# Patient Record
Sex: Female | Born: 1970 | Race: Black or African American | Hispanic: No | Marital: Single | Smoking: Current every day smoker
Health system: Southern US, Community
[De-identification: ages and names within clinical notes are randomized; demographics above are authoritative.]

## PROBLEM LIST (undated history)

## (undated) DIAGNOSIS — F17200 Nicotine dependence, unspecified, uncomplicated: Secondary | ICD-10-CM

## (undated) DIAGNOSIS — T7840XA Allergy, unspecified, initial encounter: Secondary | ICD-10-CM

## (undated) DIAGNOSIS — D649 Anemia, unspecified: Secondary | ICD-10-CM

## (undated) DIAGNOSIS — I10 Essential (primary) hypertension: Secondary | ICD-10-CM

## (undated) HISTORY — DX: Anemia, unspecified: D64.9

## (undated) HISTORY — DX: Nicotine dependence, unspecified, uncomplicated: F17.200

## (undated) HISTORY — DX: Essential (primary) hypertension: I10

## (undated) HISTORY — PX: TONSILLECTOMY: SUR1361

## (undated) HISTORY — DX: Allergy, unspecified, initial encounter: T78.40XA

---

## 1988-05-01 DIAGNOSIS — T7840XA Allergy, unspecified, initial encounter: Secondary | ICD-10-CM

## 1988-05-01 HISTORY — DX: Allergy, unspecified, initial encounter: T78.40XA

## 1995-05-02 DIAGNOSIS — F17200 Nicotine dependence, unspecified, uncomplicated: Secondary | ICD-10-CM

## 1995-05-02 HISTORY — DX: Nicotine dependence, unspecified, uncomplicated: F17.200

## 2003-12-21 ENCOUNTER — Ambulatory Visit (HOSPITAL_COMMUNITY): Admission: RE | Admit: 2003-12-21 | Discharge: 2003-12-21 | Payer: Self-pay | Admitting: Family Medicine

## 2004-03-23 ENCOUNTER — Ambulatory Visit: Payer: Self-pay | Admitting: Family Medicine

## 2004-05-05 ENCOUNTER — Ambulatory Visit: Payer: Self-pay | Admitting: Family Medicine

## 2004-06-28 ENCOUNTER — Ambulatory Visit: Payer: Self-pay | Admitting: Family Medicine

## 2004-09-19 ENCOUNTER — Ambulatory Visit: Payer: Self-pay | Admitting: Family Medicine

## 2005-02-09 ENCOUNTER — Ambulatory Visit: Payer: Self-pay | Admitting: Family Medicine

## 2005-06-19 ENCOUNTER — Ambulatory Visit: Payer: Self-pay | Admitting: Family Medicine

## 2005-11-07 ENCOUNTER — Ambulatory Visit: Payer: Self-pay | Admitting: Family Medicine

## 2006-03-01 ENCOUNTER — Ambulatory Visit: Payer: Self-pay | Admitting: Family Medicine

## 2006-03-01 ENCOUNTER — Other Ambulatory Visit: Admission: RE | Admit: 2006-03-01 | Discharge: 2006-03-01 | Payer: Self-pay | Admitting: Family Medicine

## 2006-03-01 ENCOUNTER — Ambulatory Visit (HOSPITAL_COMMUNITY): Admission: RE | Admit: 2006-03-01 | Discharge: 2006-03-01 | Payer: Self-pay | Admitting: Family Medicine

## 2007-03-06 ENCOUNTER — Ambulatory Visit: Payer: Self-pay | Admitting: Family Medicine

## 2007-03-06 ENCOUNTER — Other Ambulatory Visit: Admission: RE | Admit: 2007-03-06 | Discharge: 2007-03-06 | Payer: Self-pay | Admitting: Family Medicine

## 2007-03-06 ENCOUNTER — Encounter: Payer: Self-pay | Admitting: Family Medicine

## 2007-03-23 ENCOUNTER — Encounter: Payer: Self-pay | Admitting: Family Medicine

## 2007-03-23 LAB — CONVERTED CEMR LAB
ALT: 8 units/L (ref 0–35)
AST: 13 units/L (ref 0–37)
Basophils Relative: 0 % (ref 0–1)
Bilirubin, Direct: 0.1 mg/dL (ref 0.0–0.3)
Blood Glucose, Fasting: 94 mg/dL
Calcium: 9.4 mg/dL (ref 8.4–10.5)
Cholesterol: 178 mg/dL (ref 0–200)
Eosinophils Absolute: 0.2 10*3/uL (ref 0.2–0.7)
Eosinophils Relative: 3 % (ref 0–5)
Glucose, Bld: 94 mg/dL (ref 70–99)
HCT: 36.9 % (ref 36.0–46.0)
Hgb A1c MFr Bld: 5.5 % (ref 4.6–6.1)
Indirect Bilirubin: 0.2 mg/dL (ref 0.0–0.9)
Lymphs Abs: 2.5 10*3/uL (ref 0.7–4.0)
MCHC: 30.9 g/dL (ref 30.0–36.0)
MCV: 85.8 fL (ref 78.0–100.0)
Platelets: 183 10*3/uL (ref 150–400)
RDW: 13.6 % (ref 11.5–15.5)
Sodium: 141 meq/L (ref 135–145)
Total CHOL/HDL Ratio: 3.1

## 2007-05-21 ENCOUNTER — Encounter: Payer: Self-pay | Admitting: Family Medicine

## 2007-05-21 DIAGNOSIS — F17200 Nicotine dependence, unspecified, uncomplicated: Secondary | ICD-10-CM

## 2007-05-21 DIAGNOSIS — J301 Allergic rhinitis due to pollen: Secondary | ICD-10-CM

## 2007-05-21 DIAGNOSIS — G4700 Insomnia, unspecified: Secondary | ICD-10-CM | POA: Insufficient documentation

## 2007-12-03 ENCOUNTER — Encounter: Payer: Self-pay | Admitting: Family Medicine

## 2007-12-03 ENCOUNTER — Ambulatory Visit: Payer: Self-pay | Admitting: Family Medicine

## 2007-12-05 DIAGNOSIS — G44229 Chronic tension-type headache, not intractable: Secondary | ICD-10-CM

## 2008-03-04 ENCOUNTER — Ambulatory Visit: Payer: Self-pay | Admitting: Family Medicine

## 2008-03-04 ENCOUNTER — Other Ambulatory Visit: Admission: RE | Admit: 2008-03-04 | Discharge: 2008-03-04 | Payer: Self-pay | Admitting: Family Medicine

## 2008-03-04 ENCOUNTER — Encounter: Payer: Self-pay | Admitting: Family Medicine

## 2008-03-04 DIAGNOSIS — N771 Vaginitis, vulvitis and vulvovaginitis in diseases classified elsewhere: Secondary | ICD-10-CM

## 2008-03-05 ENCOUNTER — Encounter: Payer: Self-pay | Admitting: Family Medicine

## 2008-03-05 LAB — CONVERTED CEMR LAB
Candida species: NEGATIVE
Chlamydia, DNA Probe: NEGATIVE
Trichomonal Vaginitis: NEGATIVE

## 2008-03-06 DIAGNOSIS — B351 Tinea unguium: Secondary | ICD-10-CM | POA: Insufficient documentation

## 2008-03-21 ENCOUNTER — Ambulatory Visit (HOSPITAL_COMMUNITY): Admission: RE | Admit: 2008-03-21 | Discharge: 2008-03-21 | Payer: Self-pay | Admitting: Family Medicine

## 2008-03-21 ENCOUNTER — Encounter: Payer: Self-pay | Admitting: Family Medicine

## 2008-03-23 ENCOUNTER — Encounter: Payer: Self-pay | Admitting: Family Medicine

## 2008-04-01 ENCOUNTER — Telehealth: Payer: Self-pay | Admitting: Family Medicine

## 2008-04-03 LAB — CONVERTED CEMR LAB
ALT: <8 units/L (ref 0–35)
Albumin: 4.3 g/dL (ref 3.5–5.2)
Alkaline Phosphatase: 48 units/L (ref 39–117)
Basophils Absolute: 0 10*3/uL (ref 0.0–0.1)
Basophils Relative: 1 % (ref 0–1)
Chloride: 105 meq/L (ref 96–112)
HDL: 67 mg/dL (ref >39)
LDL Cholesterol: 97 mg/dL (ref 0–99)
MCHC: 30.5 g/dL (ref 30.0–36.0)
Neutro Abs: 3.6 10*3/uL (ref 1.7–7.7)
Neutrophils Relative %: 60 % (ref 43–77)
Platelets: 198 10*3/uL (ref 150–400)
Potassium: 3.9 meq/L (ref 3.5–5.3)
RDW: 13.7 % (ref 11.5–15.5)
Total Protein: 6.9 g/dL (ref 6.0–8.3)
Triglycerides: 69 mg/dL (ref <150)
VLDL: 14 mg/dL (ref 0–40)

## 2008-06-09 ENCOUNTER — Encounter: Payer: Self-pay | Admitting: Family Medicine

## 2008-06-11 ENCOUNTER — Telehealth: Payer: Self-pay | Admitting: Family Medicine

## 2008-06-17 ENCOUNTER — Telehealth: Payer: Self-pay | Admitting: Family Medicine

## 2008-06-22 ENCOUNTER — Telehealth: Payer: Self-pay | Admitting: Family Medicine

## 2008-06-24 ENCOUNTER — Telehealth: Payer: Self-pay | Admitting: Family Medicine

## 2008-07-07 ENCOUNTER — Telehealth: Payer: Self-pay | Admitting: Family Medicine

## 2008-07-08 ENCOUNTER — Encounter: Payer: Self-pay | Admitting: Family Medicine

## 2008-10-26 ENCOUNTER — Telehealth: Payer: Self-pay | Admitting: Family Medicine

## 2008-11-09 ENCOUNTER — Telehealth: Payer: Self-pay | Admitting: Family Medicine

## 2009-01-09 ENCOUNTER — Emergency Department (HOSPITAL_COMMUNITY): Admission: EM | Admit: 2009-01-09 | Discharge: 2009-01-09 | Payer: Self-pay | Admitting: Emergency Medicine

## 2009-01-12 ENCOUNTER — Ambulatory Visit: Payer: Self-pay | Admitting: Family Medicine

## 2009-03-01 ENCOUNTER — Telehealth: Payer: Self-pay | Admitting: Family Medicine

## 2009-03-12 ENCOUNTER — Encounter: Payer: Self-pay | Admitting: Family Medicine

## 2009-03-12 ENCOUNTER — Other Ambulatory Visit: Admission: RE | Admit: 2009-03-12 | Discharge: 2009-03-12 | Payer: Self-pay | Admitting: Family Medicine

## 2009-03-12 ENCOUNTER — Ambulatory Visit: Payer: Self-pay | Admitting: Family Medicine

## 2009-03-12 LAB — CONVERTED CEMR LAB: OCCULT 1: NEGATIVE

## 2009-03-16 ENCOUNTER — Ambulatory Visit (HOSPITAL_COMMUNITY): Admission: RE | Admit: 2009-03-16 | Discharge: 2009-03-16 | Payer: Self-pay | Admitting: Family Medicine

## 2009-03-29 ENCOUNTER — Telehealth: Payer: Self-pay | Admitting: Family Medicine

## 2009-07-29 ENCOUNTER — Ambulatory Visit: Payer: Self-pay | Admitting: Family Medicine

## 2009-07-29 DIAGNOSIS — R55 Syncope and collapse: Secondary | ICD-10-CM

## 2009-07-29 DIAGNOSIS — D649 Anemia, unspecified: Secondary | ICD-10-CM

## 2009-08-04 ENCOUNTER — Ambulatory Visit (HOSPITAL_COMMUNITY): Admission: RE | Admit: 2009-08-04 | Discharge: 2009-08-04 | Payer: Self-pay | Admitting: Family Medicine

## 2009-08-11 ENCOUNTER — Telehealth: Payer: Self-pay | Admitting: Family Medicine

## 2010-01-20 ENCOUNTER — Telehealth: Payer: Self-pay | Admitting: Family Medicine

## 2010-03-21 ENCOUNTER — Ambulatory Visit: Payer: Self-pay | Admitting: Family Medicine

## 2010-03-21 ENCOUNTER — Other Ambulatory Visit: Admission: RE | Admit: 2010-03-21 | Discharge: 2010-03-21 | Payer: Self-pay | Admitting: Family Medicine

## 2010-03-21 DIAGNOSIS — N920 Excessive and frequent menstruation with regular cycle: Secondary | ICD-10-CM

## 2010-04-28 ENCOUNTER — Ambulatory Visit (HOSPITAL_COMMUNITY)
Admission: RE | Admit: 2010-04-28 | Discharge: 2010-04-28 | Payer: Self-pay | Source: Home / Self Care | Attending: Family Medicine | Admitting: Family Medicine

## 2010-05-22 ENCOUNTER — Encounter: Payer: Self-pay | Admitting: Family Medicine

## 2010-06-01 NOTE — Progress Notes (Signed)
Summary: RESULTS  Phone Note Call from Patient   Summary of Call: RESULTS ON MRI AND CART. CALL BACK AT [phone]  TOMORROW Initial call taken by: Lind Guest,  August 11, 2009 2:01 PM  Follow-up for Phone Call        returned call, busy signal Follow-up by: Adella Hare LPN,  August 12, 2009 8:20 AM  Additional Follow-up for Phone Call Additional follow up Details #1::        returned call, no answer Additional Follow-up by: Adella Hare LPN,  August 13, 2009 11:22 AM

## 2010-06-01 NOTE — Assessment & Plan Note (Signed)
Summary: office visit   Vital Signs:  Patient profile:   40 year old female Menstrual status:  regular Height:      63 inches Weight:      147 pounds BMI:     26.13 O2 Sat:      97 % Pulse rate:   78 / minute Pulse rhythm:   regular Resp:     16 per minute BP sitting:   140 / 82  (left arm) Cuff size:   regular  Vitals Entered By: Everitt Amber LPN (July 29, 2009 2:56 PM)  Nutrition Counseling: Patient's BMI is greater than 25 and therefore counseled on weight management options. CC: She states she blacked out after midnight wednesday night. She had got out of bed and she felt funny and then she woke up and was on the floor. She hasn't felt right since the incident. She feels like the area over her left eye and side of nose is asleep. Had started off and on a few weeks ago but its happening more now   CC:  She states she blacked out after midnight wednesday night. She had got out of bed and she felt funny and then she woke up and was on the floor. She hasn't felt right since the incident. She feels like the area over her left eye and side of nose is asleep. Had started off and on a few weeks ago but its happening more now.  History of Present Illness: Pt reports that she passed out several night s ago, she was lightheaded felt a cold rush, then picked herself off the floor, no incontinencce or jerking. States that this is not the first incident but is the mosrt severe. States she has not felt right since falling. Reports numbness over her left eye She is currently sill smoking between 7 to 10 cigarettes daily, no quit date has been set.  Current Medications (verified): 1)  Multivitamins   Caps (Multiple Vitamin) .... Take One Capsule By Mouth Once Aday 2)  Allegra-D 12 Hour 60-120 Mg  Xr12h-Tab (Fexofenadine-Pseudoephedrine) .... Take 1 Tablet By Mouth Two Times A Day As Needed 3)  Acyclovir 400 Mg Tabs (Acyclovir) .... Take 1 Tablet By Mouth Two Times A Day  Allergies  (verified): 1)  ! Percocet  Review of Systems      See HPI General:  Complains of fatigue and weakness; denies chills and fever. Eyes:  Denies blurring, discharge, and red eye. ENT:  Denies hoarseness, nasal congestion, sinus pressure, and sore throat. CV:  Denies chest pain or discomfort and palpitations. Resp:  Denies cough and sputum productive. GI:  Denies abdominal pain, constipation, diarrhea, nausea, and vomiting. GU:  Denies dysuria and urinary frequency. MS:  Denies joint pain and stiffness. Derm:  Denies itching, lesion(s), and rash. Neuro:  Complains of numbness; left periorbital numbness in the past 4 weeks. Psych:  Denies anxiety and depression. Endo:  Denies cold intolerance, excessive hunger, excessive urination, and polyuria. Heme:  Denies abnormal bruising and bleeding. Allergy:  Complains of seasonal allergies.  Physical Exam  General:  Well-developed,well-nourished,in no acute distress; alert,appropriate and cooperative throughout examination HEENT: No facial asymmetry, possible carotid bruit EOMI, No sinus tenderness, TM's Clear, oropharynx  pink and moist.   Chest: Clear to auscultation bilaterally.  CVS: S1, S2, No murmurs, No S3.   Abd: Soft, Nontender.  MS: Adequate ROM spine, hips, shoulders and knees.  Ext: No edema.   CNS: CN 2-12 intact, power tone and sensation normal  throughout.   Skin: Intact, no visible lesions or rashes.  Psych: Good eye contact, normal affect.  Memory intact, not anxious or depressed appearing.    Impression & Recommendations:  Problem # 1:  SYNCOPE (ICD-780.2) Assessment Comment Only  Orders: Radiology Referral (Radiology)needs carotid doppler and brain scan based on h/o "falling out" and supraorbitsl numbness Radiology Referral (Radiology)  Problem # 2:  ANEMIA (ICD-285.9) Assessment: Comment Only  Orders: T-Hemoglobin ([id number]), pt advised to start one iron tablet daily  Hgb: 11.3 (03/21/2008)   Hct: 37.1  (03/21/2008)   Platelets: 198 (03/21/2008) RBC: 4.39 (03/21/2008)   RDW: 13.7 (03/21/2008)   WBC: 6.0 (03/21/2008) MCV: 84.5 (03/21/2008)   MCHC: 30.5 (03/21/2008) Retic Ct: 39.4 K/uL (03/23/2008)   Ferritin: 6 (03/23/2008) Iron: 38 (03/23/2008)   TIBC: 480 (03/23/2008)   % Sat: 8 (03/23/2008) B12: 359 (03/23/2008)   Folate: 18.4 (03/23/2008)   TSH: 0.696 (03/21/2008)  Problem # 3:  NICOTINE ADDICTION (ICD-305.1) Assessment: Unchanged  Encouraged smoking cessation and discussed different methods for smoking cessation.   Complete Medication List: 1)  Multivitamins Caps (Multiple vitamin) .... Take one capsule by mouth once aday 2)  Allegra-d 12 Hour 60-120 Mg Xr12h-tab (Fexofenadine-pseudoephedrine) .... Take 1 tablet by mouth two times a day as needed 3)  Acyclovir 400 Mg Tabs (Acyclovir) .... Take 1 tablet by mouth two times a day  Patient Instructions: 1)  F/U in 5 months. 2)  You need to get yopu labs done. 3)  You will be referred for a scan of your brain and circulatuion in the neck after you call us. 4)  Stop Smoking Tips: Choose a Quit date. Cut down before the Quit date. decide what you will do as a substitute when you feel the urge to smoke(gum,toothpick,exercise). 5)  It is important that you exercise regularly at least 20 minutes 5 times a week. If you develop chest pain, have severe difficulty breathing, or feel very tired , stop exercising immediately and seek medical attention. 6)  Start one regular strength asprin daily 7)  Pls  call back for neurologic appt if numbness around eye persists or  if you have a recurrent spell

## 2010-06-01 NOTE — Progress Notes (Signed)
  Phone Note Call from Patient   Summary of Call: [phone] patient got scratched by a wire in her couch, had tdap in 06, does she needs another yet? Initial call taken by: Adella Hare LPN,  January 20, 2010 1:23 PM  Follow-up for Phone Call        no Follow-up by: Syliva Overman MD,  January 20, 2010 5:06 PM  Additional Follow-up for Phone Call Additional follow up Details #1::        Phone Call Completed Additional Follow-up by: Adella Hare LPN,  January 21, 2010 10:19 AM

## 2010-06-01 NOTE — Letter (Signed)
Summary: Letter  Letter   Imported By: Lind Guest 07/08/2008 16:16:40  _____________________________________________________________________  External Attachment:    Type:   Image     Comment:   External Document

## 2010-06-01 NOTE — Assessment & Plan Note (Signed)
Summary: physical   Vital Signs:  Patient profile:   40 year old female Menstrual status:  regular Height:      63 inches Weight:      145.75 pounds BMI:     25.91 O2 Sat:      98 % on Room air Pulse rate:   100 / minute Pulse rhythm:   regular Resp:     16 per minute BP sitting:   132 / 82  (left arm)  Vitals Entered By: Adella Hare LPN (March 21, 2010 2:39 PM)  Nutrition Counseling: Patient's BMI is greater than 25 and therefore counseled on weight management options.  O2 Flow:  Room air CC: physical Is Patient Diabetic? No Pain Assessment Patient in pain? no       Vision Screening:Left eye with correction: 20 / 20 Right eye with correction: 20 / 20 Both eyes with correction: 20 / 20        Vision Entered By: Adella Hare LPN (March 21, 2010 2:46 PM)   CC:  physical.  History of Present Illness: Reports  that she has been doing well. Denies recent fever or chills. Denies sinus pressure, nasal congestion , ear pain or sore throat. Denies chest congestion, or cough productive of sputum. Denies chest pain, palpitations, PND, orthopnea or leg swelling. Denies abdominal pain, nausea, vomitting, diarrhea or constipation. Denies change in bowel movements or bloody stool. Denies dysuria , frequency, incontinence or hesitancy. Denies  joint pain, swelling, or reduced mobility. Denies headaches, vertigo, seizures. Denies depression, anxiety or insomnia. Denies  rash, lesions, or itch. She is tstill smoking , and is hoping to quit    Preventive Screening-Counseling & Management  Alcohol-Tobacco     Smoking Cessation Counseling: yes  Allergies: 1)  ! Percocet  Review of Systems      See HPI General:  Denies chills, fatigue, and fever. Eyes:  Denies blurring, discharge, and eye pain. GU:  Complains of abnormal vaginal bleeding; denies discharge and dysuria. Endo:  Denies excessive thirst and excessive urination. Heme:  Denies abnormal bruising and  bleeding. Allergy:  Denies hives or rash and itching eyes.  Physical Exam  General:  Well-developed,well-nourished,in no acute distress; alert,appropriate and cooperative throughout examination Head:  Normocephalic and atraumatic without obvious abnormalities. No apparent alopecia or balding. Eyes:  No corneal or conjunctival inflammation noted. EOMI. Perrla. Funduscopic exam benign, without hemorrhages, exudates or papilledema. Vision grossly normal. Ears:  External ear exam shows no significant lesions or deformities.  Otoscopic examination reveals clear canals, tympanic membranes are intact bilaterally without bulging, retraction, inflammation or discharge. Hearing is grossly normal bilaterally. Nose:  External nasal examination shows no deformity or inflammation. Nasal mucosa are pink and moist without lesions or exudates. Mouth:  Oral mucosa and oropharynx without lesions or exudates.  Teeth in good repair. Neck:  No deformities, masses, or tenderness noted. Chest Wall:  No deformities, masses, or tenderness noted. Breasts:  No mass, nodules, thickening, tenderness, bulging, retraction, inflamation, nipple discharge or skin changes noted.   Lungs:  Normal respiratory effort, chest expands symmetrically. Lungs are clear to auscultation, no crackles or wheezes. Heart:  Normal rate and regular rhythm. S1 and S2 normal without gallop, murmur, click, rub or other extra sounds. Abdomen:  Bowel sounds positive,abdomen soft and non-tender without masses, organomegaly or hernias noted. Rectal:  No external abnormalities noted. Normal sphincter tone. No rectal masses or tenderness. Genitalia:  Normal introitus for age, no external lesions, no vaginal discharge, mucosa pink and moist,  no vaginal or cervical lesions, no vaginal atrophy, no friaility or hemorrhage, normal uterus size and position, no adnexal masses or tenderness Msk:  No deformity or scoliosis noted of thoracic or lumbar spine.   Pulses:   R and L carotid,radial,femoral,dorsalis pedis and posterior tibial pulses are full and equal bilaterally Extremities:  No clubbing, cyanosis, edema, or deformity noted with normal full range of motion of all joints.   Neurologic:  No cranial nerve deficits noted. Station and gait are normal. Plantar reflexes are down-going bilaterally. DTRs are symmetrical throughout. Sensory, motor and coordinative functions appear intact. Skin:  Intact without suspicious lesions or rashes Cervical Nodes:  No lymphadenopathy noted Axillary Nodes:  No palpable lymphadenopathy Inguinal Nodes:  No significant adenopathy Psych:  Cognition and judgment appear intact. Alert and cooperative with normal attention span and concentration. No apparent delusions, illusions, hallucinations   Impression & Recommendations:  Problem # 1:  MENORRHAGIA (ICD-626.2) Assessment Comment Only  Orders: Radiology Referral (Radiology)  Problem # 2:  NICOTINE ADDICTION (ICD-305.1) Assessment: Unchanged  Encouraged smoking cessation and discussed different methods for smoking cessation.   Complete Medication List: 1)  Multivitamins Caps (Multiple vitamin) .... Take one capsule by mouth once aday 2)  Allegra-d 12 Hour 60-120 Mg Xr12h-tab (Fexofenadine-pseudoephedrine) .... Take 1 tablet by mouth two times a day as needed 3)  Acyclovir 400 Mg Tabs (Acyclovir) .... Take 1 tablet by mouth two times a day  Other Orders: T-Basic Metabolic Panel [id number]) T-Lipid Profile [phone]) T-CBC w/Diff [phone]) T-TSH [phone]) Pap Smear (59563) Hemoccult Guaiac-1 spec.(in office) (82270)  Patient Instructions: 1)  CPE in 12 months. 2)  Tobacco is very bad for your health and your loved ones! You Should stop smoking!. 3)  Stop Smoking Tips: Choose a Quit date. Cut down before the Quit date. decide what you will do as a substitute when you feel the urge to smoke(gum,toothpick,exercise). 4)  It is important that you  exercise regularly at least 20 minutes 5 times a week. If you develop chest pain, have severe difficulty breathing, or feel very tired , stop exercising immediately and seek medical attention. 5)  BMP prior to visit, ICD-9: 6)  Lipid Panel prior to visit, ICD-9:  fasting asap 7)  TSH prior to visit, ICD-9: 8)  Flu vaccinetoday. 9)  you will be referred for a pelvic US. 10)  We will give you info on endometrial ablation, a procedure to help to reduce your bleeding. 11)  No med changes 12)  CBC w/ Diff prior to visit, ICD-9:   Orders Added: 1)  Est. Patient 40-64 years [99396] 2)  T-Basic Metabolic Panel [[id number]] 3)  T-Lipid Profile [[id number]] 4)  T-CBC w/Diff [[id number]] 5)  T-TSH [[id number]] 6)  Pap Smear [88150] 7)  Hemoccult Guaiac-1 spec.(in office) [82270] 8)  Radiology Referral [Radiology] 9)  Radiology Referral [Radiology]    Laboratory Results  Date/Time Received: March 21, 2010 3:29 PM  Date/Time Reported: March 21, 2010 3:29 PM   Stool - Occult Blood Hemmoccult #1: negative Date: 03/21/2010 Comments: 5030 5/14 50590 1L 03/12 Adella Hare LPN  March 21, 2010 3:29 PM

## 2010-07-19 ENCOUNTER — Telehealth: Payer: Self-pay | Admitting: Family Medicine

## 2010-07-21 ENCOUNTER — Ambulatory Visit (INDEPENDENT_AMBULATORY_CARE_PROVIDER_SITE_OTHER): Payer: BC Managed Care – PPO

## 2010-07-21 VITALS — BP 118/80 | HR 86 | Resp 16 | Ht 63.0 in | Wt 150.4 lb

## 2010-07-21 DIAGNOSIS — I10 Essential (primary) hypertension: Secondary | ICD-10-CM

## 2010-08-01 NOTE — Progress Notes (Signed)
Patient had called in reporting elevated BP readings. Advised her to come in for BP check. It was normal in the office and she has no hx of previous high readings. Did not wish for the doctor to come in and recheck.

## 2010-10-16 ENCOUNTER — Emergency Department (HOSPITAL_COMMUNITY)
Admission: EM | Admit: 2010-10-16 | Discharge: 2010-10-16 | Disposition: A | Discharge status: Home / Self Care | Payer: BC Managed Care – PPO | Attending: Emergency Medicine | Admitting: Emergency Medicine

## 2010-10-16 ENCOUNTER — Emergency Department (HOSPITAL_COMMUNITY): Payer: BC Managed Care – PPO

## 2010-10-16 DIAGNOSIS — Z79899 Other long term (current) drug therapy: Secondary | ICD-10-CM | POA: Insufficient documentation

## 2010-10-16 DIAGNOSIS — R071 Chest pain on breathing: Secondary | ICD-10-CM | POA: Insufficient documentation

## 2010-10-16 LAB — BASIC METABOLIC PANEL
BUN: 9 mg/dL (ref 6–23)
CO2: 23 mEq/L (ref 19–32)
Chloride: 103 mEq/L (ref 96–112)
Creatinine, Ser: 0.64 mg/dL (ref 0.50–1.10)
Glucose, Bld: 103 mg/dL — ABNORMAL HIGH (ref 70–99)

## 2010-10-16 LAB — CK TOTAL AND CKMB (NOT AT ARMC): Relative Index: 1.6 (ref 0.0–2.5)

## 2010-10-16 LAB — CBC
HCT: 35.5 % — ABNORMAL LOW (ref 36.0–46.0)
Hemoglobin: 11 g/dL — ABNORMAL LOW (ref 12.0–15.0)
MCH: 24.9 pg — ABNORMAL LOW (ref 26.0–34.0)
MCV: 80.3 fL (ref 78.0–100.0)
Platelets: 203 10*3/uL (ref 150–400)
RBC: 4.42 MIL/uL (ref 3.87–5.11)

## 2010-10-16 LAB — DIFFERENTIAL
Eosinophils Absolute: 0.3 10*3/uL (ref 0.0–0.7)
Lymphocytes Relative: 26 % (ref 12–46)
Lymphs Abs: 1.9 10*3/uL (ref 0.7–4.0)
Monocytes Relative: 10 % (ref 3–12)
Neutrophils Relative %: 60 % (ref 43–77)

## 2010-10-16 LAB — TROPONIN I: Troponin I: <0.3 ng/mL (ref <0.30)

## 2010-10-18 NOTE — Telephone Encounter (Signed)
Pt came in office for BP on 07/20/2010

## 2011-05-17 ENCOUNTER — Encounter: Payer: Self-pay | Admitting: Family Medicine

## 2011-05-22 ENCOUNTER — Encounter: Payer: BC Managed Care – PPO | Admitting: Family Medicine

## 2011-06-28 ENCOUNTER — Encounter: Payer: BC Managed Care – PPO | Admitting: Family Medicine

## 2011-12-11 ENCOUNTER — Other Ambulatory Visit (HOSPITAL_COMMUNITY): Payer: Self-pay | Admitting: Nurse Practitioner

## 2011-12-11 DIAGNOSIS — Z139 Encounter for screening, unspecified: Secondary | ICD-10-CM

## 2011-12-14 ENCOUNTER — Ambulatory Visit (HOSPITAL_COMMUNITY)
Admission: RE | Admit: 2011-12-14 | Discharge: 2011-12-14 | Disposition: A | Discharge status: Home / Self Care | Payer: Self-pay | Source: Ambulatory Visit | Attending: Nurse Practitioner | Admitting: Nurse Practitioner

## 2011-12-14 DIAGNOSIS — Z139 Encounter for screening, unspecified: Secondary | ICD-10-CM

## 2012-09-04 ENCOUNTER — Other Ambulatory Visit (HOSPITAL_COMMUNITY): Payer: Self-pay | Admitting: Nurse Practitioner

## 2012-09-04 DIAGNOSIS — N920 Excessive and frequent menstruation with regular cycle: Secondary | ICD-10-CM

## 2012-09-09 ENCOUNTER — Ambulatory Visit (HOSPITAL_COMMUNITY): Payer: Self-pay

## 2012-09-09 ENCOUNTER — Ambulatory Visit (HOSPITAL_COMMUNITY): Admission: RE | Admit: 2012-09-09 | Payer: Self-pay | Source: Ambulatory Visit

## 2012-09-16 ENCOUNTER — Ambulatory Visit (HOSPITAL_COMMUNITY): Payer: Self-pay

## 2012-09-19 ENCOUNTER — Ambulatory Visit (HOSPITAL_COMMUNITY)
Admission: RE | Admit: 2012-09-19 | Discharge: 2012-09-19 | Disposition: A | Discharge status: Home / Self Care | Payer: Self-pay | Source: Ambulatory Visit | Attending: Obstetrics and Gynecology | Admitting: Obstetrics and Gynecology

## 2012-09-19 DIAGNOSIS — D252 Subserosal leiomyoma of uterus: Secondary | ICD-10-CM | POA: Insufficient documentation

## 2012-09-19 DIAGNOSIS — N83209 Unspecified ovarian cyst, unspecified side: Secondary | ICD-10-CM | POA: Insufficient documentation

## 2012-09-19 DIAGNOSIS — N920 Excessive and frequent menstruation with regular cycle: Secondary | ICD-10-CM | POA: Insufficient documentation

## 2012-09-24 ENCOUNTER — Other Ambulatory Visit (HOSPITAL_COMMUNITY): Payer: Self-pay | Admitting: Nurse Practitioner

## 2012-09-24 DIAGNOSIS — N83209 Unspecified ovarian cyst, unspecified side: Secondary | ICD-10-CM

## 2012-09-30 ENCOUNTER — Ambulatory Visit (HOSPITAL_COMMUNITY)
Admission: RE | Admit: 2012-09-30 | Discharge: 2012-09-30 | Disposition: A | Discharge status: Home / Self Care | Payer: Self-pay | Source: Ambulatory Visit | Attending: Family Medicine | Admitting: Family Medicine

## 2012-09-30 DIAGNOSIS — N83209 Unspecified ovarian cyst, unspecified side: Secondary | ICD-10-CM | POA: Insufficient documentation

## 2012-09-30 MED ORDER — GADOBENATE DIMEGLUMINE 529 MG/ML IV SOLN
15.0000 mL | Freq: Once | INTRAVENOUS | Status: AC | PRN
  Administered 2012-09-30: 15 mL via INTRAVENOUS

## 2012-10-23 ENCOUNTER — Encounter: Payer: Self-pay | Admitting: Obstetrics and Gynecology

## 2012-10-23 ENCOUNTER — Ambulatory Visit (INDEPENDENT_AMBULATORY_CARE_PROVIDER_SITE_OTHER): Payer: Self-pay | Admitting: Obstetrics and Gynecology

## 2012-10-23 VITALS — BP 158/90 | Ht 62.5 in | Wt 141.6 lb

## 2012-10-23 DIAGNOSIS — N83209 Unspecified ovarian cyst, unspecified side: Secondary | ICD-10-CM

## 2012-10-23 NOTE — Progress Notes (Signed)
   [facility] Visit  Patient name: Kathy Stewart MRN [id number]  Date of birth: [birth year]  CC & HPI:  Kathy Stewart is a 42 y.o. female presenting today for REFERRAL FOR UT FIBROIDS. Also ? Ov cyst on left that is small but cystic with a thin septation.   ROS:  Menses last 7-14 days, stayed 21 days last June. Having irreg bleeding despite camilla.   Pertinent History Reviewed:  Medical & Surgical Hx:  Reviewed: Significant for cesarean x 1, G1 P1. SON 22 NO BTL. SINGLE, NO PARTNER X 2 YRS.  Medications: Reviewed & Updated - see associated section Social History: Reviewed -  reports that she has been smoking Cigarettes.  She has been smoking about 5.00 packs per day. She does not have any smokeless tobacco history on file.  Objective Findings:  Vitals: BP 158/90  Ht 5' 2.5" (1.588 m)  Wt 141 lb 9.6 oz (64.229 kg)  BMI 25.47 kg/m2  LMP 09/30/2012  Physical Examination:  Mental status - alert, oriented to person, place, and time Physical Examination: General appearance - alert, well appearing, and in no distress, oriented to person, place, and time and normal appearing weight Mental status - alert, oriented to person, place, and time, normal mood, behavior, speech, dress, motor activity, and thought processes Abdomen - soft, nontender, nondistended, no masses or organomegaly bowel sounds normal scars from previous incisions midline vertical lower abdominal scar from umbilicus to mons pubis   Mri: MRI reviewed the uterus is significantly enlarged with multiple fibroids including a dominant submucosal fibroid deforming the endometrial cavity and making conservative therapy and unlikely. Endometrial stripe is thin and deviated by the submucosal fibroid . The cystic lesion in the left adnexa is quite small 2.5 x 5.5 x 2.6 cm and may represent a hydrosalpinx, paratubal cyst or cystic ovarian lesion. CA 125 has been drawn results are 4.7, normal  Assessment & Plan:    ENDO BX and Pap smear to be performed  patient to return for both CONTINUE ocp, consider lupron ca125  RESults 4.7, normal Will need hyst.

## 2012-10-23 NOTE — Patient Instructions (Signed)
followup fo r endometrial biopsy And review of labs

## 2012-10-30 ENCOUNTER — Other Ambulatory Visit: Payer: Self-pay | Admitting: Obstetrics and Gynecology

## 2012-11-07 ENCOUNTER — Encounter: Payer: Self-pay | Admitting: Obstetrics and Gynecology

## 2012-11-07 ENCOUNTER — Ambulatory Visit (INDEPENDENT_AMBULATORY_CARE_PROVIDER_SITE_OTHER): Payer: Self-pay | Admitting: Obstetrics and Gynecology

## 2012-11-07 ENCOUNTER — Other Ambulatory Visit: Payer: Self-pay | Admitting: Obstetrics and Gynecology

## 2012-11-07 VITALS — BP 158/90 | Ht 63.0 in | Wt 142.8 lb

## 2012-11-07 DIAGNOSIS — D259 Leiomyoma of uterus, unspecified: Secondary | ICD-10-CM | POA: Insufficient documentation

## 2012-11-07 DIAGNOSIS — Z3202 Encounter for pregnancy test, result negative: Secondary | ICD-10-CM

## 2012-11-07 DIAGNOSIS — N938 Other specified abnormal uterine and vaginal bleeding: Secondary | ICD-10-CM

## 2012-11-07 DIAGNOSIS — N949 Unspecified condition associated with female genital organs and menstrual cycle: Secondary | ICD-10-CM

## 2012-11-07 NOTE — Patient Instructions (Signed)
Dawn little to coordinate surgery date

## 2012-11-07 NOTE — Progress Notes (Signed)
Patient ID: Kathy Stewart, female   DOB: 1971/02/27, 42 y.o.   MRN: [id number] Pt here today for Endometrial Biopsy for possible surgery for Uterine Fibroids.    Kathy Stewart is an 42 y.o. female. Here for preop and endometrial biopsy  Past Medical History  Diagnosis Date  . Anemia     Pertinent Gynecological History: Menses:  Bleeding: dysfunctional uterine bleeding Contraception:  DES exposure: unknown Blood transfusions: none Sexually transmitted diseases: no past history Previous GYN Procedures:   Last mammogram:  Date:  Last pap: normal Date: ann[facility]all at [facility], 2011 and prior record all normal in EPIC, pt reports normal pap at health dept this year.  OB History: G, P 2  Menstrual History: Menarche age:  Patient's last menstrual period was 10/24/2012.    Past Medical History  Diagnosis Date  . Anemia     Past Surgical History  Procedure Laterality Date  . Cesarean section    . Tonsilectomy/adenoidectomy with myringotomy      Family History  Problem Relation Age of Onset  . Hypertension Mother   . Kidney disease Mother   . Cancer Father     bladder  . Hypertension Maternal Grandmother   . Diabetes Maternal Grandmother   . Cataracts Maternal Grandmother     Social History:  reports that she has been smoking Cigarettes.  She has been smoking about 5.00 packs per day. She does not have any smokeless tobacco history on file. She reports that  drinks alcohol. She reports that she does not use illicit drugs.  Allergies:  Allergies  Allergen Reactions  . Oxycodone-Acetaminophen      (Not in a hospital admission)  ROS  Blood pressure 158/90, height 5\' 3"  (1.6 m), weight 142 lb 12.8 oz (64.774 kg), last menstrual period 10/24/2012. Physical Exam Physical Examination: General appearance - alert, well appearing, and in no distress, oriented to person, place, and time and normal appearing weight Mental status - alert, oriented to person, place, and  time, normal mood, behavior, speech, dress, motor activity, and thought processes Chest - clear to auscultation, no wheezes, rales or rhonchi, symmetric air entry Heart - normal rate and regular rhythm Abdomen - soft, nontender, nondistended, no masses or organomegaly Pelvic - normal external genitalia, vulva, vagina, cervix, uterus and adnexa uterus sounded to 8 cm on endometrial bx. Extremities - peripheral pulses normal, no pedal edema, no clubbing or cyanosis   Endometrial Biopsy: Patient given informed consent, signed copy in the chart, time out was performed. Time out taken. . The patient was placed in the lithotomy position and the cervix brought into view with sterile speculum.  Portio of cervix cleansed x 2 with betadine swabs.  A tenaculum was placed in the anterior lip of the cervix. The uterus was sounded for depth of 8 cm,. Milex uterine Explora 3 mm was introduced to into the uterus, suction created,  and an endometrial sample was obtained. All equipment was removed and accounted for.   The patient tolerated the procedure well.    Patient given post procedure instructions. The patient will be scheduled for hysterectomy, after results reviewed and pap results obtained from health Dept  Results for orders placed in visit on 11/07/12 (from the past 24 hour(s))  POCT URINE PREGNANCY     Status: None   Collection Time    11/07/12  9:11 AM      Result Value Range   Preg Test, Ur Negative      No results  found.  Assessment/Plan: Supracervical hysterectomy with removal of fallopian tubes, and wide excision of old cesarean scar(cicatrix) possible removal of left ovary, depending on findings at surgery. Surgery desired late August will schedule a preop visit, as desired time of surgery greater than 30 days.  Tarrell Debes V 11/07/2012, 9:50 AM

## 2012-11-11 ENCOUNTER — Encounter: Payer: Self-pay | Admitting: Obstetrics and Gynecology

## 2012-11-29 HISTORY — PX: ABDOMINAL HYSTERECTOMY: SHX81

## 2012-12-02 ENCOUNTER — Encounter (HOSPITAL_COMMUNITY): Payer: Self-pay | Admitting: Pharmacy Technician

## 2012-12-12 ENCOUNTER — Encounter (HOSPITAL_COMMUNITY): Admission: RE | Admit: 2012-12-12 | Payer: Self-pay | Source: Ambulatory Visit

## 2012-12-12 ENCOUNTER — Other Ambulatory Visit: Payer: Self-pay | Admitting: Obstetrics and Gynecology

## 2012-12-12 ENCOUNTER — Encounter: Payer: Self-pay | Admitting: Obstetrics and Gynecology

## 2012-12-12 NOTE — Progress Notes (Signed)
Orders for preop labs at [facility] completed.

## 2012-12-12 NOTE — H&P (Signed)
  [facility] Visit   Patient name: Kathy Stewart MRN [id number] Date of birth: 06-Aug-1970  CC & HPI:   LESLEY ATKIN is a 42 y.o. female presenting today for Abdominal supracervical hysterectomy,. Also ? Ov cyst on left that is small but cystic with a thin septation that will be inspected at time of surgery. Ca125 is normal.  ROS:   Menses last 7-14 days, stayed 21 days last June. Having irreg bleeding despite camilla.  Pertinent History Reviewed:   Medical & Surgical Hx: Reviewed: Significant for cesarean x 1, G1 P1. SON 22  NO BTL. SINGLE, NO PARTNER X 2 YRS.  Medications: Reviewed & Updated - see associated section  Social History: Reviewed - reports that she has been smoking Cigarettes. She has been smoking about 5.00 packs per day. She does not have any smokeless tobacco history on file.  Objective Findings:   Vitals: BP 158/90  Ht 5' 2.5" (1.588 m)  Wt 141 lb 9.6 oz (64.229 kg)  BMI 25.47 kg/m2  LMP 09/30/2012  Physical Examination:  Mental status - alert, oriented to person, place, and time  Physical Examination: General appearance - alert, well appearing, and in no distress, oriented to person, place, and time and normal appearing weight  Mental status - alert, oriented to person, place, and time, normal mood, behavior, speech, dress, motor activity, and thought processes  Abdomen - soft, nontender, nondistended, no masses or organomegaly  bowel sounds normal  scars from previous incisions midline vertical lower abdominal scar from umbilicus to mons pubis  Mri: MRI reviewed the uterus is significantly enlarged with multiple fibroids including a dominant submucosal fibroid deforming the endometrial cavity and making conservative therapy and unlikely. Endometrial stripe is thin and deviated by the submucosal fibroid  . The cystic lesion in the left adnexa is quite small 2.5 x 5.5 x 2.6 cm and may represent a hydrosalpinx, paratubal cyst or cystic ovarian lesion. CA  125 has been drawn results are 4.7, normal  Endometrial biopsy has been done:results showed only endocervical benign tissues, and no endometrial cells. This is likely due to deformity of endometrium by submucosal fibroid.   Assessment & Plan:    Uterine Fibroids, altering endometrium  Plan: supracervical hysterectomy thru prior abdominal incision.   Risks, alternatives and possible complications such as bleeding , infection, scar tissue, future surgery  To address ovary concerns if ovaries preserved,all discussed with patient.

## 2012-12-13 ENCOUNTER — Encounter (HOSPITAL_COMMUNITY): Payer: Self-pay

## 2012-12-13 ENCOUNTER — Ambulatory Visit (INDEPENDENT_AMBULATORY_CARE_PROVIDER_SITE_OTHER): Payer: Self-pay | Admitting: Obstetrics and Gynecology

## 2012-12-13 ENCOUNTER — Encounter: Payer: Self-pay | Admitting: Obstetrics and Gynecology

## 2012-12-13 ENCOUNTER — Encounter (HOSPITAL_COMMUNITY)
Admission: RE | Admit: 2012-12-13 | Discharge: 2012-12-13 | Disposition: A | Discharge status: Home / Self Care | Payer: Self-pay | Source: Ambulatory Visit | Attending: Obstetrics and Gynecology | Admitting: Obstetrics and Gynecology

## 2012-12-13 VITALS — BP 128/82 | Ht 62.0 in | Wt 142.2 lb

## 2012-12-13 DIAGNOSIS — Z01812 Encounter for preprocedural laboratory examination: Secondary | ICD-10-CM | POA: Insufficient documentation

## 2012-12-13 DIAGNOSIS — Z01818 Encounter for other preprocedural examination: Secondary | ICD-10-CM | POA: Insufficient documentation

## 2012-12-13 DIAGNOSIS — N938 Other specified abnormal uterine and vaginal bleeding: Secondary | ICD-10-CM

## 2012-12-13 DIAGNOSIS — N949 Unspecified condition associated with female genital organs and menstrual cycle: Secondary | ICD-10-CM

## 2012-12-13 LAB — BASIC METABOLIC PANEL
BUN: 13 mg/dL (ref 6–23)
Calcium: 9.8 mg/dL (ref 8.4–10.5)
Creatinine, Ser: 0.69 mg/dL (ref 0.50–1.10)
GFR calc Af Amer: >90 mL/min (ref >90)
GFR calc non Af Amer: >90 mL/min (ref >90)
Glucose, Bld: 83 mg/dL (ref 70–99)

## 2012-12-13 LAB — POCT WET PREP (WET MOUNT): Clue Cells Wet Prep Whiff POC: NEGATIVE

## 2012-12-13 LAB — URINALYSIS, ROUTINE W REFLEX MICROSCOPIC
Glucose, UA: NEGATIVE mg/dL
Hgb urine dipstick: NEGATIVE
Ketones, ur: 40 mg/dL — AB
Leukocytes, UA: NEGATIVE
Protein, ur: NEGATIVE mg/dL
Urobilinogen, UA: 0.2 mg/dL (ref 0.0–1.0)

## 2012-12-13 LAB — CBC
HCT: 40.3 % (ref 36.0–46.0)
Hemoglobin: 13.2 g/dL (ref 12.0–15.0)
MCH: 28.9 pg (ref 26.0–34.0)
MCHC: 32.8 g/dL (ref 30.0–36.0)
MCV: 88.2 fL (ref 78.0–100.0)
Platelets: 156 K/uL (ref 150–400)
RBC: 4.57 MIL/uL (ref 3.87–5.11)
RDW: 13 % (ref 11.5–15.5)
WBC: 8 K/uL (ref 4.0–10.5)

## 2012-12-13 LAB — TYPE AND SCREEN: Antibody Screen: NEGATIVE

## 2012-12-13 MED ORDER — MEGESTROL ACETATE 40 MG PO TABS: 40.0000 mg | ORAL_TABLET | Freq: Two times a day (BID) | ORAL | Status: DC

## 2012-12-13 NOTE — Patient Instructions (Addendum)
Take bowel prep the day before surgery, Magnesium Citrate one bottle. Pre op appt at 11 am today

## 2012-12-13 NOTE — Progress Notes (Signed)
Kathy Stewart is an 42 y.o. female.  She is here for final check preop for abdominal supracervical hysterectomy.Also ? Ov cyst on left that is small but cystic with a thin septation that will be inspected at time of surgery. Ca125 is normal.  Endometrial biopsy done 11/07/12 was benign. Pap from [facility] was benign.    Pertinent Gynecological History: Menses: flow is excessive with use of both pads or tampons on heaviest days with resultant anemia Bleeding: heavy, but tolerated by pt Contraception: tubal ligation DES exposure: unknownno past history Blood transfusions: none Sexually transmitted diseases:  Previous GYN Procedures: endometrial biopsy  Last mammogram: normal Date:  Last pap: normal Date: 11/14 OB History:                             G 2 , P2   Menstrual History: Menarche age:  Patient's last menstrual period was 09/12/2012.    Past Medical History  Diagnosis Date  . Anemia     Past Surgical History  Procedure Laterality Date  . Cesarean section    . Tonsillectomy      Family History  Problem Relation Age of Onset  . Hypertension Mother   . Kidney disease Mother   . Cancer Father     bladder  . Hypertension Maternal Grandmother   . Diabetes Maternal Grandmother   . Cataracts Maternal Grandmother     Social History:  reports that she has been smoking Cigarettes.  She has a 4.25 pack-year smoking history. She does not have any smokeless tobacco history on file. She reports that  drinks alcohol. She reports that she does not use illicit drugs.  Allergies:  Allergies  Allergen Reactions  . Oxycodone-Acetaminophen Itching     (Not in a hospital admission)  ROS  Blood pressure 128/82, height 5\' 2"  (1.575 m), weight 142 lb 3.2 oz (64.501 kg), last menstrual period 09/12/2012. Physical Exam /Physical Examination: General appearance - alert, well appearing, and in no distress, oriented to person, place, and time and normal appearing weight Mental status  - alert, oriented to person, place, and time, normal mood, behavior, speech, dress, motor activity, and thought processes Eyes - pupils equal and reactive, extraocular eye movements intact Chest - clear to auscultation, no wheezes, rales or rhonchi, symmetric air entry Heart - normal rate and regular rhythm Abdomen - soft, nontender, nondistended, no masses or organomegaly scars from previous incisions lower abd midline Pelvic - VULVA: normal appearing vulva with no masses, tenderness or lesions, VAGINA: normal appearing vagina with normal color and discharge, no lesions, PELVIC FLOOR EXAM: no cystocele, rectocele or prolapse noted, CERVIX: normal appearing cervix without discharge or lesions, UTERUS: uterus is normal size, shape, consistency and nontender, enlarged to 14 week's size, ADNEXA: normal adnexa in size, nontender and no masses, no masses Rectal - normal rectal, no masses, no rectocele   Results for orders placed in visit on 12/13/12 (from the past 24 hour(s))  POCT WET PREP (WET MOUNT)     Status: None   Collection Time    12/13/12 10:44 AM      Result Value Range   Source Wet Prep POC       WBC, Wet Prep HPF POC non     Bacteria Wet Prep HPF POC       BACTERIA WET PREP MORPHOLOGY POC       Clue Cells Wet Prep HPF POC  CLUE CELLS WET PREP WHIFF POC Negative Whiff     Yeast Wet Prep HPF POC None     KOH Wet Prep POC       Trichomonas Wet Prep HPF POC non      No results found.  Assessment/Plan: Uterine fibroids with - pap, -endometrial biopsy Plan: Abdominal supracervical hysterectomy, thru midline vertical incison.          Evaluation of cystic ovary, possible removeal of one or both ovaries and or tubes, depending of clinical findings at surgery Bowel prep today Preservation of ovaries bilateral , unless abnormality found at time of surgery, at which time unilateral or bilateral s&O are possibilities  Kathy Stewart V 12/13/2012, 1:28 PM

## 2012-12-13 NOTE — Patient Instructions (Addendum)
Kathy Stewart  12/13/2012   Your procedure is scheduled on:  12/17/2012  Report to [facility] at  615  AM.  Call this number if you have problems the morning of surgery: [phone]   Remember:   Do not eat food or drink liquids after midnight.   Take these medicines the morning of surgery with A SIP OF WATER: none   Do not wear jewelry, make-up or nail polish.  Do not wear lotions, powders, or perfumes.   Do not shave 48 hours prior to surgery. Men may shave face and neck.  Do not bring valuables to the hospital.  [facility] is not responsible for any belongings or valuables.  Contacts, dentures or bridgework may not be worn into surgery.  Leave suitcase in the car. After surgery it may be brought to your room.  For patients admitted to the hospital, checkout time is 11:00 AM the day of discharge.   Patients discharged the day of surgery will not be allowed to drive home.  Name and phone number of your driver: family  Special Instructions: Shower using CHG 2 nights before surgery and the night before surgery.  If you shower the day of surgery use CHG.  Use special wash - you have one bottle of CHG for all showers.  You should use approximately 1/3 of the bottle for each shower.   Please read over the following fact sheets that you were given: Pain Booklet, Coughing and Deep Breathing, Surgical Site Infection Prevention, Anesthesia Post-op Instructions and Care and Recovery After Surgery Supracervical Hysterectomy A supracervical hysterectomy is minimally invasive surgery to remove the top part of the uterus, but not the cervix. This surgery can be performed by making a large cut (incision) in the abdomen. It can also be done with a thin, lighted tube (laparoscope) inserted into 2 small incisions in the lower abdomen. Your fallopian tubes and ovaries can be removed (bilateral salpingo-oopherectomy) during this surgery as well. If a supracervical hysterectomy is started and it is not  safe to continue, the laparoscopic surgery will be converted to an open abdominal surgery. You will not have menstrual periods or be able to get pregnant after having this surgery. If a bilateral salpingo-oopherectomy was performed before menopause, you will go through a sudden (abrupt) menopause. This can be helped with hormone medicines. Benefits of minimally invasive surgery include:  Less pain.  Less risk of blood loss.  Less risk of infection.  Quicker return to normal activities.  Usually a 1 night stay in the hospital.  Overall patient satisfaction. LET YOUR CAREGIVER KNOW ABOUT:  Any history of abnormal Pap tests.  Allergies to food or medicine.  Medicines taken, including vitamins, herbs, eyedrops, over-the-counter medicines, and creams.  Use of steroids (by mouth or creams).  Previous problems with anesthetics or numbing medicines.  History of bleeding problems or blood clots.  Previous surgery.  Other health problems, including diabetes and kidney problems.  Any infections or colds you may have developed.  Symptoms of irregular or heavy periods, weight loss, or urinary or bowel changes. RISKS AND COMPLICATIONS   Bleeding.  Blood clots in the legs or lung.  Infection.  Injury to surrounding organs.  Problems with anesthesia.  Risk of conversion to an open abdominal incision.  Early menopause symptoms (hot flashes, night sweats, insomnia).  Additional surgery later to remove the cervix if you have problems with the cervix. BEFORE THE PROCEDURE  Ask your caregiver about changing  or stopping your regular medicines.  Do not take aspirin or blood thinners (anticoagulants) for 1 week before the surgery, or as told by your caregiver.  Do not eat or drink anything for 8 hours before the surgery, or as told by your caregiver.  Quit smoking if you smoke.  Arrange for a ride home after surgery and for someone to help you at home during  recovery. PROCEDURE   You will be given an antibiotic medicine.  An intravenous (IV) line will be placed in your arm. You will be given medicine to make you sleep (general anesthetic).  A gas (carbon dioxide) will be used to inflate your abdomen. This will allow your surgeon to look inside your abdomen, perform your surgery, and treat any other problems found if necessary.  Three or four small incisions (often less than  inch) will be made in your abdomen. One of these incisions will be made in the area of your belly button (navel). The laparoscope will be inserted into the incision. Your surgeon will look through the laparoscope while doing your procedure.  Other surgical instruments will be inserted through the other incisions.  The uterus will be cut into small pieces and removed through the small incisions.  Your incisions will be closed. AFTER THE PROCEDURE   The gas will be released from inside your abdomen.  You will be taken to the recovery area where a nurse will watch and check your progress. Once you are awake, stable, and taking fluids well, without other problems, you will return to your room or be allowed to go home.  There is usually minimal discomfort following the surgery because the incisions are so small.  You will be given pain medicine while you are in the hospital and for when you go home.  Try to have someone with you for the first 3 to 5 days after you go home.  Follow up with your surgeon in 2 to 4 weeks after surgery to evaluate your progress. Document Released: 10/04/2007 Document Revised: 07/10/2011 Document Reviewed: 12/02/2010 [facility] Patient Information 2014 [place], [place]. PATIENT INSTRUCTIONS POST-ANESTHESIA  IMMEDIATELY FOLLOWING SURGERY:  Do not drive or operate machinery for the first twenty four hours after surgery.  Do not make any important decisions for twenty four hours after surgery or while taking narcotic pain medications or sedatives.   If you develop intractable nausea and vomiting or a severe headache please notify your doctor immediately.  FOLLOW-UP:  Please make an appointment with your surgeon as instructed. You do not need to follow up with anesthesia unless specifically instructed to do so.  WOUND CARE INSTRUCTIONS (if applicable):  Keep a dry clean dressing on the anesthesia/puncture wound site if there is drainage.  Once the wound has quit draining you may leave it open to air.  Generally you should leave the bandage intact for twenty four hours unless there is drainage.  If the epidural site drains for more than 36-48 hours please call the anesthesia department.  QUESTIONS?:  Please feel free to call your physician or the hospital operator if you have any questions, and they will be happy to assist you.      Start Magnesium Citrate at 12 noon on 12/16/2012. Drink plenty of liquids after you take this. Once you drink the Magnesium Citrate, you may only have brooth, jello, popsicles, anything to drink except for milk and milk products. No red drinks or foods. May have as much of these as you want until midnight. Nothing  to eat or drink after midnight except for medicine with sip of water am of surgery.

## 2012-12-14 LAB — GC/CHLAMYDIA PROBE AMP: GC Probe RNA: NEGATIVE

## 2012-12-16 NOTE — H&P (Signed)
  [facility] Visit   Patient name: Kathy Stewart MRN [id number] Date of birth: [birth year]  CC & HPI:   REGIS WILAND is a 42 y.o. female presenting today for Abdominal supracervical hysterectomy,. Also ? Ov cyst on left that is small but cystic with a thin septation that will be inspected at time of surgery. Ca125 is normal. Depending on whether this is a small cyst in the ovary, or other etiology, removal of the left adnexal cyst structure may be necessary. ROS:   Menses last 7-14 days, stayed 21 days last June. Having irreg bleeding despite camilla.  Pertinent History Reviewed:   Medical & Surgical Hx: Reviewed: Significant for cesarean x 1, G1 P1. SON 22  NO BTL. SINGLE, NO PARTNER X 2 YRS.  Medications: Reviewed & Updated - see associated section  Social History: Reviewed - reports that she has been smoking Cigarettes. She has been smoking about 0.25 packs per day. She does not have any smokeless tobacco history on file.  Objective Findings:   Vitals: BP 158/90  Ht 5' 2.5" (1.588 m)  Wt 141 lb 9.6 oz (64.229 kg)  BMI 25.47 kg/m2  LMP 09/30/2012  Physical Examination:  Mental status - alert, oriented to person, place, and time  Physical Examination: General appearance - alert, well appearing, and in no distress, oriented to person, place, and time and normal appearing weight  Mental status - alert, oriented to person, place, and time, normal mood, behavior, speech, dress, motor activity, and thought processes  Abdomen - soft, nontender, nondistended, no masses or organomegaly  bowel sounds normal  scars from previous incisions midline vertical lower abdominal scar from umbilicus to mons pubis  Mri: MRI reviewed the uterus is significantly enlarged with multiple fibroids including a dominant submucosal fibroid deforming the endometrial cavity and making conservative therapy and unlikely. Endometrial stripe is thin and deviated by the submucosal fibroid  . The cystic  lesion in the left adnexa is quite small 2.5 x 5.5 x 2.6 cm and may represent a hydrosalpinx, paratubal cyst or cystic ovarian lesion. CA 125 has been drawn results are 4.7, normal  Endometrial biopsy has been done:results showed only endocervical benign tissues, and no endometrial cells. This is likely due to deformity of endometrium by submucosal fibroid.  Assessment & Plan:    Uterine Fibroids, altering endometrium  Plan: supracervical hysterectomy thru prior abdominal incision. Possible removal of left adnexa, (left tube and / or left ovary) Risks, alternatives and possible complications such as bleeding , infection, scar tissue, future surgery to address ovary concerns if ovaries preserved,all discussed with patient.

## 2012-12-17 ENCOUNTER — Encounter (HOSPITAL_COMMUNITY): Payer: Self-pay | Admitting: Anesthesiology

## 2012-12-17 ENCOUNTER — Inpatient Hospital Stay (HOSPITAL_COMMUNITY): Payer: Self-pay | Admitting: Anesthesiology

## 2012-12-17 ENCOUNTER — Encounter (HOSPITAL_COMMUNITY)
Admission: RE | Disposition: A | Discharge status: Home / Self Care | Payer: Self-pay | Source: Ambulatory Visit | Attending: Obstetrics and Gynecology

## 2012-12-17 ENCOUNTER — Inpatient Hospital Stay (HOSPITAL_COMMUNITY)
Admission: RE | Admit: 2012-12-17 | Discharge: 2012-12-19 | DRG: 743 | Disposition: A | Discharge status: Home / Self Care | Payer: MEDICAID | Source: Ambulatory Visit | Attending: Obstetrics and Gynecology | Admitting: Obstetrics and Gynecology

## 2012-12-17 DIAGNOSIS — D252 Subserosal leiomyoma of uterus: Secondary | ICD-10-CM

## 2012-12-17 DIAGNOSIS — D279 Benign neoplasm of unspecified ovary: Secondary | ICD-10-CM | POA: Diagnosis present

## 2012-12-17 DIAGNOSIS — D259 Leiomyoma of uterus, unspecified: Principal | ICD-10-CM | POA: Diagnosis present

## 2012-12-17 DIAGNOSIS — F17200 Nicotine dependence, unspecified, uncomplicated: Secondary | ICD-10-CM | POA: Diagnosis present

## 2012-12-17 DIAGNOSIS — D250 Submucous leiomyoma of uterus: Secondary | ICD-10-CM

## 2012-12-17 DIAGNOSIS — D251 Intramural leiomyoma of uterus: Secondary | ICD-10-CM

## 2012-12-17 HISTORY — PX: BILATERAL SALPINGECTOMY: SHX5743

## 2012-12-17 HISTORY — PX: SUPRACERVICAL ABDOMINAL HYSTERECTOMY: SHX5393

## 2012-12-17 HISTORY — PX: SALPINGOOPHORECTOMY: SHX82

## 2012-12-17 LAB — CBC
HCT: 36.9 % (ref 36.0–46.0)
MCH: 28.4 pg (ref 26.0–34.0)
MCHC: 32.5 g/dL (ref 30.0–36.0)
MCV: 87.4 fL (ref 78.0–100.0)
RDW: 13 % (ref 11.5–15.5)

## 2012-12-17 LAB — BASIC METABOLIC PANEL
BUN: 8 mg/dL (ref 6–23)
Calcium: 8.9 mg/dL (ref 8.4–10.5)
Creatinine, Ser: 0.64 mg/dL (ref 0.50–1.10)
GFR calc Af Amer: >90 mL/min (ref >90)
GFR calc non Af Amer: >90 mL/min (ref >90)

## 2012-12-17 SURGERY — HYSTERECTOMY, SUPRACERVICAL, ABDOMINAL
Anesthesia: General | Site: Abdomen | Wound class: Clean

## 2012-12-17 MED ORDER — MIDAZOLAM HCL 2 MG/2ML IJ SOLN
INTRAMUSCULAR | Status: AC
  Filled 2012-12-17: qty 2

## 2012-12-17 MED ORDER — LACTATED RINGERS IV SOLN
INTRAVENOUS | Status: DC
  Administered 2012-12-17 (×3): via INTRAVENOUS

## 2012-12-17 MED ORDER — ROCURONIUM BROMIDE 100 MG/10ML IV SOLN
INTRAVENOUS | Status: DC | PRN
  Administered 2012-12-17: 50 mg via INTRAVENOUS

## 2012-12-17 MED ORDER — NEOSTIGMINE METHYLSULFATE 1 MG/ML IJ SOLN
INTRAMUSCULAR | Status: AC
  Filled 2012-12-17: qty 1

## 2012-12-17 MED ORDER — DIPHENHYDRAMINE HCL 50 MG/ML IJ SOLN: 12.5000 mg | Freq: Four times a day (QID) | INTRAMUSCULAR | Status: DC | PRN

## 2012-12-17 MED ORDER — GLYCOPYRROLATE 0.2 MG/ML IJ SOLN
INTRAMUSCULAR | Status: DC | PRN
  Administered 2012-12-17: 0.4 mg via INTRAVENOUS

## 2012-12-17 MED ORDER — ONDANSETRON HCL 4 MG/2ML IJ SOLN: 4.0000 mg | Freq: Four times a day (QID) | INTRAMUSCULAR | Status: DC | PRN

## 2012-12-17 MED ORDER — SODIUM CHLORIDE 0.9 % IJ SOLN: 9.0000 mL | INTRAMUSCULAR | Status: DC | PRN

## 2012-12-17 MED ORDER — KETOROLAC TROMETHAMINE 30 MG/ML IJ SOLN
30.0000 mg | Freq: Four times a day (QID) | INTRAMUSCULAR | Status: DC
  Administered 2012-12-17 – 2012-12-19 (×7): 30 mg via INTRAVENOUS
  Filled 2012-12-17: qty 1
  Filled 2012-12-17: qty 2
  Filled 2012-12-17 (×5): qty 1

## 2012-12-17 MED ORDER — KETOROLAC TROMETHAMINE 30 MG/ML IJ SOLN
30.0000 mg | Freq: Four times a day (QID) | INTRAMUSCULAR | Status: DC
  Administered 2012-12-17: 30 mg via INTRAMUSCULAR

## 2012-12-17 MED ORDER — HYDROMORPHONE 0.3 MG/ML IV SOLN: INTRAVENOUS | Status: DC

## 2012-12-17 MED ORDER — EPHEDRINE SULFATE 50 MG/ML IJ SOLN
INTRAMUSCULAR | Status: AC
  Filled 2012-12-17: qty 1

## 2012-12-17 MED ORDER — ONDANSETRON HCL 4 MG/2ML IJ SOLN: 4.0000 mg | Freq: Once | INTRAMUSCULAR | Status: DC | PRN

## 2012-12-17 MED ORDER — TRAMADOL HCL 50 MG PO TABS: 50.0000 mg | ORAL_TABLET | Freq: Four times a day (QID) | ORAL | Status: DC | PRN

## 2012-12-17 MED ORDER — ROCURONIUM BROMIDE 50 MG/5ML IV SOLN
INTRAVENOUS | Status: AC
  Filled 2012-12-17: qty 1

## 2012-12-17 MED ORDER — NALOXONE HCL 0.4 MG/ML IJ SOLN: 0.4000 mg | INTRAMUSCULAR | Status: DC | PRN

## 2012-12-17 MED ORDER — CEFAZOLIN SODIUM-DEXTROSE 2-3 GM-% IV SOLR
INTRAVENOUS | Status: AC
  Filled 2012-12-17: qty 50

## 2012-12-17 MED ORDER — DEXAMETHASONE SODIUM PHOSPHATE 4 MG/ML IJ SOLN
4.0000 mg | Freq: Once | INTRAMUSCULAR | Status: AC
  Administered 2012-12-17: 4 mg via INTRAVENOUS

## 2012-12-17 MED ORDER — ONDANSETRON HCL 4 MG/2ML IJ SOLN
4.0000 mg | Freq: Once | INTRAMUSCULAR | Status: AC
  Administered 2012-12-17: 4 mg via INTRAVENOUS

## 2012-12-17 MED ORDER — FENTANYL CITRATE 0.05 MG/ML IJ SOLN: 25.0000 ug | INTRAMUSCULAR | Status: DC | PRN

## 2012-12-17 MED ORDER — PANTOPRAZOLE SODIUM 40 MG PO TBEC
40.0000 mg | DELAYED_RELEASE_TABLET | Freq: Every day | ORAL | Status: DC
  Administered 2012-12-18 – 2012-12-19 (×2): 40 mg via ORAL
  Filled 2012-12-17 (×2): qty 1

## 2012-12-17 MED ORDER — NEOSTIGMINE METHYLSULFATE 1 MG/ML IJ SOLN
INTRAMUSCULAR | Status: DC | PRN
  Administered 2012-12-17 (×2): 1 mg via INTRAVENOUS

## 2012-12-17 MED ORDER — GLYCOPYRROLATE 0.2 MG/ML IJ SOLN
INTRAMUSCULAR | Status: AC
  Filled 2012-12-17: qty 2

## 2012-12-17 MED ORDER — DIPHENHYDRAMINE HCL 12.5 MG/5ML PO ELIX: 12.5000 mg | ORAL_SOLUTION | Freq: Four times a day (QID) | ORAL | Status: DC | PRN

## 2012-12-17 MED ORDER — LIDOCAINE HCL (PF) 1 % IJ SOLN
INTRAMUSCULAR | Status: AC
  Filled 2012-12-17: qty 5

## 2012-12-17 MED ORDER — FENTANYL CITRATE 0.05 MG/ML IJ SOLN
INTRAMUSCULAR | Status: AC
  Filled 2012-12-17: qty 2

## 2012-12-17 MED ORDER — ONDANSETRON HCL 4 MG/2ML IJ SOLN
INTRAMUSCULAR | Status: AC
  Filled 2012-12-17: qty 2

## 2012-12-17 MED ORDER — KETOROLAC TROMETHAMINE 30 MG/ML IJ SOLN
30.0000 mg | Freq: Once | INTRAMUSCULAR | Status: AC
  Administered 2012-12-17: 30 mg via INTRAVENOUS

## 2012-12-17 MED ORDER — PROPOFOL 10 MG/ML IV BOLUS
INTRAVENOUS | Status: DC | PRN
  Administered 2012-12-17: 150 mg via INTRAVENOUS

## 2012-12-17 MED ORDER — HYDROMORPHONE 0.3 MG/ML IV SOLN
INTRAVENOUS | Status: DC
  Administered 2012-12-17: 15:00:00 via INTRAVENOUS

## 2012-12-17 MED ORDER — FENTANYL CITRATE 0.05 MG/ML IJ SOLN
INTRAMUSCULAR | Status: DC | PRN
  Administered 2012-12-17 (×4): 50 ug via INTRAVENOUS
  Administered 2012-12-17: 100 ug via INTRAVENOUS
  Administered 2012-12-17: 50 ug via INTRAVENOUS

## 2012-12-17 MED ORDER — KCL IN DEXTROSE-NACL 20-5-0.45 MEQ/L-%-% IV SOLN
INTRAVENOUS | Status: DC
Start: 2012-12-17 — End: 2012-12-19
  Administered 2012-12-17 – 2012-12-18 (×2): via INTRAVENOUS

## 2012-12-17 MED ORDER — DEXAMETHASONE SODIUM PHOSPHATE 4 MG/ML IJ SOLN
INTRAMUSCULAR | Status: AC
  Filled 2012-12-17: qty 1

## 2012-12-17 MED ORDER — LIDOCAINE HCL 1 % IJ SOLN
INTRAMUSCULAR | Status: DC | PRN
  Administered 2012-12-17: 50 mg via INTRADERMAL

## 2012-12-17 MED ORDER — FENTANYL CITRATE 0.05 MG/ML IJ SOLN
INTRAMUSCULAR | Status: AC
  Filled 2012-12-17: qty 5

## 2012-12-17 MED ORDER — MIDAZOLAM HCL 2 MG/2ML IJ SOLN
1.0000 mg | INTRAMUSCULAR | Status: DC | PRN
  Administered 2012-12-17: 2 mg via INTRAVENOUS

## 2012-12-17 MED ORDER — CEFAZOLIN SODIUM-DEXTROSE 2-3 GM-% IV SOLR
2.0000 g | INTRAVENOUS | Status: AC
  Administered 2012-12-17: 2 g via INTRAVENOUS

## 2012-12-17 MED ORDER — DOCUSATE SODIUM 100 MG PO CAPS
100.0000 mg | ORAL_CAPSULE | Freq: Two times a day (BID) | ORAL | Status: DC
  Administered 2012-12-18 – 2012-12-19 (×3): 100 mg via ORAL
  Filled 2012-12-17 (×3): qty 1

## 2012-12-17 MED ORDER — MIDAZOLAM HCL 5 MG/5ML IJ SOLN
INTRAMUSCULAR | Status: DC | PRN
  Administered 2012-12-17: 2 mg via INTRAVENOUS

## 2012-12-17 MED ORDER — 0.9 % SODIUM CHLORIDE (POUR BTL) OPTIME
TOPICAL | Status: DC | PRN
  Administered 2012-12-17 (×2): 1000 mL

## 2012-12-17 MED ORDER — PROPOFOL 10 MG/ML IV EMUL
INTRAVENOUS | Status: AC
  Filled 2012-12-17: qty 20

## 2012-12-17 MED ORDER — EPHEDRINE SULFATE 50 MG/ML IJ SOLN
INTRAMUSCULAR | Status: DC | PRN
  Administered 2012-12-17: 5 mg via INTRAVENOUS

## 2012-12-17 MED ORDER — SIMETHICONE 80 MG PO CHEW
80.0000 mg | CHEWABLE_TABLET | Freq: Four times a day (QID) | ORAL | Status: DC | PRN
  Administered 2012-12-19: 80 mg via ORAL
  Filled 2012-12-17: qty 1

## 2012-12-17 MED ORDER — KETOROLAC TROMETHAMINE 30 MG/ML IJ SOLN
INTRAMUSCULAR | Status: AC
  Filled 2012-12-17: qty 1

## 2012-12-17 MED ORDER — ONDANSETRON HCL 4 MG PO TABS: 4.0000 mg | ORAL_TABLET | Freq: Four times a day (QID) | ORAL | Status: DC | PRN

## 2012-12-17 SURGICAL SUPPLY — 56 items
APL SKNCLS STERI-STRIP NONHPOA (GAUZE/BANDAGES/DRESSINGS) ×3
BAG HAMPER (MISCELLANEOUS) ×4 IMPLANT
BENZOIN TINCTURE PRP APPL 2/3 (GAUZE/BANDAGES/DRESSINGS) ×1 IMPLANT
CLOTH BEACON ORANGE TIMEOUT ST (SAFETY) ×4 IMPLANT
COVER LIGHT HANDLE STERIS (MISCELLANEOUS) ×8 IMPLANT
DRAPE WARM FLUID 44X44 (DRAPE) ×4 IMPLANT
DRESSING ALLEVYN BORDER 5X5 (GAUZE/BANDAGES/DRESSINGS) ×1 IMPLANT
DRESSING TELFA 8X3 (GAUZE/BANDAGES/DRESSINGS) IMPLANT
ELECT REM PT RETURN 9FT ADLT (ELECTROSURGICAL) ×4
ELECTRODE REM PT RTRN 9FT ADLT (ELECTROSURGICAL) ×3 IMPLANT
EVACUATOR DRAINAGE 10X20 100CC (DRAIN) IMPLANT
EVACUATOR SILICONE 100CC (DRAIN)
FORMALIN 10 PREFIL 480ML (MISCELLANEOUS) ×3 IMPLANT
GLOVE BIOGEL PI IND STRL 7.0 (GLOVE) IMPLANT
GLOVE BIOGEL PI IND STRL 7.5 (GLOVE) IMPLANT
GLOVE BIOGEL PI IND STRL 9 (GLOVE) ×3 IMPLANT
GLOVE BIOGEL PI INDICATOR 7.0 (GLOVE) ×1
GLOVE BIOGEL PI INDICATOR 7.5 (GLOVE) ×2
GLOVE BIOGEL PI INDICATOR 9 (GLOVE) ×1
GLOVE ECLIPSE 6.5 STRL STRAW (GLOVE) ×1 IMPLANT
GLOVE ECLIPSE 7.0 STRL STRAW (GLOVE) ×1 IMPLANT
GLOVE ECLIPSE 9.0 STRL (GLOVE) ×4 IMPLANT
GLOVE EXAM NITRILE PF MED BLUE (GLOVE) ×2 IMPLANT
GOWN STRL REIN 3XL LVL4 (GOWN DISPOSABLE) ×4 IMPLANT
GOWN STRL REIN XL XLG (GOWN DISPOSABLE) ×9 IMPLANT
INST SET MAJOR GENERAL (KITS) ×4 IMPLANT
KIT ROOM TURNOVER APOR (KITS) ×4 IMPLANT
MANIFOLD NEPTUNE II (INSTRUMENTS) ×4 IMPLANT
NS IRRIG 1000ML POUR BTL (IV SOLUTION) ×8 IMPLANT
PACK ABDOMINAL MAJOR (CUSTOM PROCEDURE TRAY) ×4 IMPLANT
PAD ARMBOARD 7.5X6 YLW CONV (MISCELLANEOUS) ×4 IMPLANT
RETRACTOR WND ALEXIS 25 LRG (MISCELLANEOUS) IMPLANT
RTRCTR WOUND ALEXIS 25CM LRG (MISCELLANEOUS)
SET BASIN LINEN APH (SET/KITS/TRAYS/PACK) ×4 IMPLANT
SOL PREP PROV IODINE SCRUB 4OZ (MISCELLANEOUS) ×4 IMPLANT
SPONGE DRAIN TRACH 4X4 STRL 2S (GAUZE/BANDAGES/DRESSINGS) IMPLANT
SPONGE GAUZE 4X4 12PLY (GAUZE/BANDAGES/DRESSINGS) IMPLANT
SPONGE LAP 18X18 X RAY DECT (DISPOSABLE) ×1 IMPLANT
STAPLER VISISTAT 35W (STAPLE) IMPLANT
STRIP CLOSURE SKIN 1/2X4 (GAUZE/BANDAGES/DRESSINGS) ×1 IMPLANT
SUT CHROMIC 0 CT 1 (SUTURE) ×38 IMPLANT
SUT CHROMIC 2 0 CT 1 (SUTURE) ×8 IMPLANT
SUT CHROMIC GUT BROWN 0 54 (SUTURE) IMPLANT
SUT CHROMIC GUT BROWN 0 54IN (SUTURE)
SUT ETHILON 3 0 FSL (SUTURE) IMPLANT
SUT PDS AB CT VIOLET #0 27IN (SUTURE) IMPLANT
SUT PLAIN CT 1/2CIR 2-0 27IN (SUTURE) ×4 IMPLANT
SUT PROLENE 0 CT 1 30 (SUTURE) ×1 IMPLANT
SUT VIC AB 0 CT1 27 (SUTURE) ×4
SUT VIC AB 0 CT1 27XBRD ANTBC (SUTURE) IMPLANT
SUT VIC AB 4-0 PS2 27 (SUTURE) ×1 IMPLANT
SUT VICRYL 4 0 KS 27 (SUTURE) IMPLANT
SUT VICRYL AB 2 0 TIES (SUTURE) ×4 IMPLANT
TOWEL BLUE STERILE X RAY DET (MISCELLANEOUS) ×4 IMPLANT
TOWEL OR 17X26 4PK STRL BLUE (TOWEL DISPOSABLE) ×3 IMPLANT
TRAY FOLEY CATH 16FR SILVER (SET/KITS/TRAYS/PACK) ×4 IMPLANT

## 2012-12-17 NOTE — Anesthesia Postprocedure Evaluation (Signed)
  Anesthesia Post-op Note  Patient: Kathy Stewart  Procedure(s) Performed: Procedure(s): ABDOMINAL SUPRACERVICAL HYSTERECTOMY (N/A) BILATERAL SALPINGECTOMY (Bilateral) SALPINGO OOPHORECTOMY (Left)  Patient Location: PACU  Anesthesia Type:General  Level of Consciousness: awake, alert , oriented and patient cooperative  Airway and Oxygen Therapy: Patient Spontanous Breathing  Post-op Pain: 3 /10, mild  Post-op Assessment: Post-op Vital signs reviewed, Patient's Cardiovascular Status Stable, Respiratory Function Stable, Patent Airway, No signs of Nausea or vomiting and Pain level controlled  Post-op Vital Signs: Reviewed and stable  Complications: No apparent anesthesia complications

## 2012-12-17 NOTE — Transfer of Care (Signed)
Immediate Anesthesia Transfer of Care Note  Patient: Kathy Stewart  Procedure(s) Performed: Procedure(s): ABDOMINAL SUPRACERVICAL HYSTERECTOMY (N/A) BILATERAL SALPINGECTOMY (Bilateral) SALPINGO OOPHORECTOMY (Left)  Patient Location: PACU  Anesthesia Type:General  Level of Consciousness: awake, alert  and patient cooperative  Airway & Oxygen Therapy: Patient Spontanous Breathing and Patient connected to face mask oxygen  Post-op Assessment: Report given to PACU RN, Post -op Vital signs reviewed and stable and Patient moving all extremities  Post vital signs: Reviewed and stable  Complications: No apparent anesthesia complications

## 2012-12-17 NOTE — Interval H&P Note (Signed)
History and Physical Interval Note:  12/17/2012 7:24 AM  Kathy Stewart  has presented today for surgery, with the diagnosis of uterine fibroids, ovarian cyst  The various methods of treatment have been discussed with the patient and family. After consideration of risks, benefits and other options for treatment, the patient has consented to  Procedure(s) with comments: HYSTERECTOMY SUPRACERVICAL ABDOMINAL (N/A) BILATERAL SALPINGECTOMY (Bilateral) - Bilateral salpingectomy, with possible left oophorectomy, depending on findings at time of surgery as a surgical intervention .  The patient's history has been reviewed, patient examined, no change in status, stable for surgery.  I have reviewed the patient's chart and labs.  Questions were answered to the patient's satisfaction.     Tilda Burrow

## 2012-12-17 NOTE — Anesthesia Preprocedure Evaluation (Addendum)
Anesthesia Evaluation  Patient identified by MRN, date of birth, ID band Patient awake    Reviewed: Allergy & Precautions, H&P , NPO status , Patient's Chart, lab work & pertinent test results  Airway Mallampati: II TM Distance: >3 FB Neck ROM: Full    Dental  (+) Teeth Intact   Pulmonary Current Smoker,  breath sounds clear to auscultation        Cardiovascular negative cardio ROS  Rhythm:Regular Rate:Normal     Neuro/Psych  Headaches,    GI/Hepatic negative GI ROS,   Endo/Other    Renal/GU      Musculoskeletal   Abdominal   Peds  Hematology   Anesthesia Other Findings   Reproductive/Obstetrics                           Anesthesia Physical Anesthesia Plan  ASA: I  Anesthesia Plan: General   Post-op Pain Management:    Induction: Intravenous  Airway Management Planned: Oral ETT  Additional Equipment:   Intra-op Plan:   Post-operative Plan: Extubation in OR  Informed Consent: I have reviewed the patients History and Physical, chart, labs and discussed the procedure including the risks, benefits and alternatives for the proposed anesthesia with the patient or authorized representative who has indicated his/her understanding and acceptance.     Plan Discussed with:   Anesthesia Plan Comments:         Anesthesia Quick Evaluation

## 2012-12-17 NOTE — Op Note (Signed)
PRE-OPERATIVE DIAGNOSIS:  uterine fibroids 14 weeks, ovarian cyst  POST-OPERATIVE DIAGNOSIS:  uterine fibroids, ovarian cyst  PROCEDURE:  Procedure(s): ABDOMINAL SUPRACERVICAL HYSTERECTOMY (N/A) BILATERAL SALPINGECTOMY (Bilateral) SALPINGO OOPHORECTOMY (Left)  SURGEON:  Surgeon(s) and Role:    * Tilda Burrow, MD - Primary  Details of procedure: Patient was taken to the operating room prepped and draped for lower abdominal surgery with prior midline vertical incision utilized in excising the old cicatrix and entering the abdominal cavity through the vertical incision after timeout, Ancef administered preoperatively and procedure confirmed by surgical team. Foley catheter had been placed. The bowel was packed away. There were no adhesions to the anterior abdominal wall. There was no suspicion of trauma, associated with peritoneal entry. Bowel was packed away, and attention directed to the round ligaments which were taken down bilaterally clamped cut and suture ligated on each hand. Bladder flap was developed anteriorly. Left adnexal structures were inspected. The large cystic structure on the left ovary appeared to be stable and suspicious for pain non-functional cyst so decision was made at the left adnexa would be removed. Initially the left infundibulopelvic ligament was not adequate accessible so the utero-ovarian ligament was clamped cut and suture ligated along with the tube sinus treated similarly. The right ovary was grossly normal. Uterine vessels were skeletonized, and on the left side doubly clamped with curved Heaney clamps, Kelly clamp place for backbleeding, and then transected and doubly ligated. The right side was treated similarly. Each side then had straight Heaney clamp used on the upper cardinal ligaments clamping cutting and ligating the uterine vessels even further the uterine body was then amputated off the lower uterine site and, and then after palpation identified the cervix  and the uterosacral ligament insertion and then, the lower uterine segment was amputated off the cervix and a conical shape to allow sufficient room to place a oversewing running suture of 2-0 chromic across the upper stump of the cervix hemostasis good. Attention was directed to the left adnexa where the left ureter could be visualized well out of harm's way, the left IP ligament isolated and clamped cut and suture ligated. This pedicle had to be re\re time due to directed to the initial 2-0 suture again the ureter was inspected prior placement of suture and confirmed post ligature. as patent, and intact and well away from all sutures the pelvis was irrigated, confirmed as hemostatic, the peritoneum from the bladder flap was oversewn the cuff times 1 interrupted suture of 2-0 chromic, and hemostasis confirmed, laparotomy equipment removed, and the anterior peritoneum closed with 2-0 chromic at the peritoneum, 0 Prolene at the fashion, 20 plain in the subcutaneous fatty space and 4-0 Vicryl on the skin, place subcuticularly Steri-Strips were applied and pressure dressing applied EBL 150 cc condition to recovery in stable

## 2012-12-17 NOTE — Brief Op Note (Signed)
12/17/2012  10:05 AM  PATIENT:  Kathy Stewart  42 y.o. female  PRE-OPERATIVE DIAGNOSIS:  uterine fibroids 14 weeks, ovarian cyst  POST-OPERATIVE DIAGNOSIS:  uterine fibroids, ovarian cyst  PROCEDURE:  Procedure(s): ABDOMINAL SUPRACERVICAL HYSTERECTOMY (N/A) BILATERAL SALPINGECTOMY (Bilateral) SALPINGO OOPHORECTOMY (Left)  SURGEON:  Surgeon(s) and Role:    * Tilda Burrow, MD - Primary  PHYSICIAN ASSISTANT:   ASSISTANTS: Holly , RN,    ANESTHESIA:   general  EBL:  Total I/O In: 2100 [I.V.:2100] Out: 250 [Urine:100; Blood:150]  BLOOD ADMINISTERED:none  DRAINS: Urinary Catheter (Foley)   LOCAL MEDICATIONS USED:  NONE  SPECIMEN:  Source of Specimen:  uterus, tubes, left ovary  DISPOSITION OF SPECIMEN:  PATHOLOGY  COUNTS:  YES  TOURNIQUET:  * No tourniquets in log *  DICTATION: .Dragon Dictation  PLAN OF CARE: Admit to inpatient   PATIENT DISPOSITION:  PACU - hemodynamically stable.   Delay start of Pharmacological VTE agent (>24hrs) due to surgical blood loss or risk of bleeding: yes

## 2012-12-17 NOTE — Anesthesia Procedure Notes (Signed)
Procedure Name: Intubation Date/Time: 12/17/2012 7:45 AM Performed by: Despina Hidden Pre-anesthesia Checklist: Emergency Drugs available, Suction available, Patient being monitored and Patient identified Patient Re-evaluated:Patient Re-evaluated prior to inductionOxygen Delivery Method: Circle system utilized Preoxygenation: Pre-oxygenation with 100% oxygen Intubation Type: IV induction Ventilation: Mask ventilation without difficulty Laryngoscope Size: Mac and 3 Grade View: Grade I Tube type: Oral Tube size: 7.0 mm Number of attempts: 1 Airway Equipment and Method: Stylet Placement Confirmation: ETT inserted through vocal cords under direct vision,  positive ETCO2 and breath sounds checked- equal and bilateral Secured at: 22 cm Tube secured with: Tape Dental Injury: Teeth and Oropharynx as per pre-operative assessment

## 2012-12-18 ENCOUNTER — Encounter (HOSPITAL_COMMUNITY): Payer: Self-pay | Admitting: *Deleted

## 2012-12-18 MED ORDER — HYDROMORPHONE 0.3 MG/ML IV SOLN: INTRAVENOUS | Status: DC

## 2012-12-18 NOTE — Care Management Note (Addendum)
    Page 1 of 1   12/19/2012     4:04:34 PM   CARE MANAGEMENT NOTE 12/19/2012  Patient:  Kathy Stewart, Kathy Stewart   Account Number:  [id number]  Date Initiated:  12/18/2012  Documentation initiated by:  Sharrie Rothman  Subjective/Objective Assessment:   Pt admitted from home s/p hysterectomy. Pt lives with her son and will return home at discharge. Pt is independent with ADL's.     Action/Plan:   No CM needs noted. Pt has already spoke with financial counselor. Pt stated that she can afford her medication at discharge.   Anticipated DC Date:  12/19/2012   Anticipated DC Plan:  HOME/SELF CARE  In-house referral  Financial Counselor      DC Planning Services  CM consult      Choice offered to / List presented to:             Status of service:  Completed, signed off Medicare Important Message given?   (If response is "NO", the following Medicare IM given date fields will be blank) Date Medicare IM given:   Date Additional Medicare IM given:    Discharge Disposition:  HOME/SELF CARE  Per UR Regulation:    If discussed at Long Length of Stay Meetings, dates discussed:    Comments:  12/19/12 1605 Arlyss Queen, RN BSN CM Pt discharged home today. No CM needs noted.  12/18/12 1500 Arlyss Queen, RN BSN CM

## 2012-12-18 NOTE — Anesthesia Postprocedure Evaluation (Signed)
  Anesthesia Post-op Note  Patient: Kathy Stewart  Procedure(s) Performed: Procedure(s): ABDOMINAL SUPRACERVICAL HYSTERECTOMY (N/A) BILATERAL SALPINGECTOMY (Bilateral) SALPINGO OOPHORECTOMY (Left)  Patient Location: Room 217  Anesthesia Type:General  Level of Consciousness: awake, alert , oriented and patient cooperative  Airway and Oxygen Therapy: Patient Spontanous Breathing  Post-op Pain: mild  Post-op Assessment: Post-op Vital signs reviewed, Patient's Cardiovascular Status Stable, Respiratory Function Stable, Patent Airway, No signs of Nausea or vomiting, Adequate PO intake and Pain level controlled  Post-op Vital Signs: Reviewed and stable  Complications: No apparent anesthesia complications

## 2012-12-18 NOTE — Progress Notes (Signed)
1 Day Post-Op Procedure(s) (LRB): ABDOMINAL SUPRACERVICAL HYSTERECTOMY (N/A) BILATERAL SALPINGECTOMY (Bilateral) SALPINGO OOPHORECTOMY (Left)  Subjective: Patient reports tolerating PO, + flatus and no problems voiding.  Pain minimal  Objective: I have reviewed patient's vital signs and labs. CBC    Component Value Date/Time   WBC 12.5* 12/17/2012 1211   RBC 4.22 12/17/2012 1211   HGB 12.0 12/17/2012 1211   HCT 36.9 12/17/2012 1211   PLT 138* 12/17/2012 1211   MCV 87.4 12/17/2012 1211   MCH 28.4 12/17/2012 1211   MCHC 32.5 12/17/2012 1211   RDW 13.0 12/17/2012 1211   LYMPHSABS 1.9 10/16/2010 0850   MONOABS 0.7 10/16/2010 0850   EOSABS 0.3 10/16/2010 0850   BASOSABS 0.0 10/16/2010 0850     General: alert, cooperative and no distress Resp: clear to auscultation bilaterally GI: soft, non-tender; bowel sounds normal; no masses,  no organomegaly and incision: clean, dry and intact  Assessment: s/p Procedure(s): ABDOMINAL SUPRACERVICAL HYSTERECTOMY (N/A) BILATERAL SALPINGECTOMY (Bilateral) SALPINGO OOPHORECTOMY (Left): stable and progressing well  Plan: Advance diet Encourage ambulation Advance to PO medication Discontinue IV fluids  LOS: 1 day    Riva Sesma V 12/18/2012, 9:44 AM

## 2012-12-18 NOTE — Progress Notes (Signed)
UR chart review completed.  

## 2012-12-19 LAB — CBC
HCT: 33.1 % — ABNORMAL LOW (ref 36.0–46.0)
MCHC: 33.2 g/dL (ref 30.0–36.0)
Platelets: 131 10*3/uL — ABNORMAL LOW (ref 150–400)
RDW: 13.3 % (ref 11.5–15.5)

## 2012-12-19 LAB — GLUCOSE, CAPILLARY: Glucose-Capillary: 89 mg/dL (ref 70–99)

## 2012-12-19 MED ORDER — TRAMADOL HCL 50 MG PO TABS: 50.0000 mg | ORAL_TABLET | Freq: Four times a day (QID) | ORAL | Status: DC | PRN

## 2012-12-19 MED ORDER — DSS 100 MG PO CAPS: 100.0000 mg | ORAL_CAPSULE | Freq: Two times a day (BID) | ORAL | Status: DC

## 2012-12-19 NOTE — Progress Notes (Signed)
Pt discharged home today per Dr. Emelda Fear. Pt's IV site D/C'd by NT and WNL. Pt's VS stable at this time. Pt provided with prescriptions, medication list, discharge instructions and hysterectomy after care instructions. Verbalized understanding. Pt made aware of follow up appointment already in place with Dr. Rayna Sexton office.  Pt verbalized date and time.  Pt left floor via WC in stable condition accompanied by NT, Cheron Every.  Dagoberto Ligas, RN

## 2012-12-19 NOTE — Discharge Summary (Signed)
Physician Discharge Summary  Patient ID: Kathy Stewart MRN: [id number] DOB/AGE: 1970/12/23 42 y.o.  Admit date: 12/17/2012 Discharge date: 12/19/2012  Admission Diagnoses: Uterine fibroids 14 week size left ovarian cyst  Discharge Diagnoses: Same, also left ovarian cyst as cystadenofibroma Active Problems:   * No active hospital problems. *   Discharged Condition: good  Hospital Course: This 42 year old female was admitted for abdominal hysterectomy removed through the midline vertical lower abdominal incision. Supracervical hysterectomy with was performed as well as left salpingo-oophorectomy. 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  Consults: None  Significant Diagnostic Studies: labs:  CBC    Component Value Date/Time   WBC 7.7 12/19/2012 0526   RBC 3.72* 12/19/2012 0526   HGB 11.0* 12/19/2012 0526   HCT 33.1* 12/19/2012 0526   PLT 131* 12/19/2012 0526   MCV 89.0 12/19/2012 0526   MCH 29.6 12/19/2012 0526   MCHC 33.2 12/19/2012 0526   RDW 13.3 12/19/2012 0526   LYMPHSABS 1.9 10/16/2010 0850   MONOABS 0.7 10/16/2010 0850   EOSABS 0.3 10/16/2010 0850   BASOSABS 0.0 10/16/2010 0850      Treatments: surgery: Abdominal supracervical hysterectomy and left salpingo-oophorectomy  Discharge Exam: Blood pressure 146/89, pulse 69, temperature 98.7 F (37.1 C), temperature source Oral, resp. rate 20, height 5\' 2"  (1.575 m), weight 142 lb (64.41 kg), SpO2 98.00%. General appearance: alert, cooperative and no distress Head: Normocephalic, without obvious abnormality, atraumatic Resp: clear to auscultation bilaterally GI: soft, non-tender; bowel sounds normal; no masses,  no organomegaly and Incision clean and dry dressing in place was removed in Steri-Strips were held and the incision is clean Extremities: Homans sign is negative, no sign of DVT  Disposition: 01-Home or Self Care  Discharge Orders   Future Appointments  Provider Department Dept Phone   12/27/2012 9:30 AM Tilda Burrow, MD [facility] [phone]   Future Orders Complete By Expires   Call MD for:  temperature >100.4  As directed    Diet - low sodium heart healthy  As directed    Increase activity slowly  As directed    Remove dressing in 48 hours  As directed        Medication List    STOP taking these medications       diphenhydrAMINE 25 MG tablet  Commonly known as:  SOMINEX     megestrol 40 MG tablet  Commonly known as:  MEGACE     MULTIVITAMIN PO     norethindrone 0.35 MG tablet  Commonly known as:  MICRONOR,CAMILA,ERRIN      TAKE these medications       DSS 100 MG Caps  Take 100 mg by mouth 2 (two) times daily.     ferrous sulfate 325 (65 FE) MG tablet  Take 325 mg by mouth daily with breakfast.     ibuprofen 200 MG tablet  Commonly known as:  ADVIL,MOTRIN  Take 200 mg by mouth every 6 (six) hours as needed for pain.     traMADol 50 MG tablet  Commonly known as:  ULTRAM  Take 1 tablet (50 mg total) by mouth every 6 (six) hours as needed for pain.           Follow-up Information   Follow up with Tilda Burrow, MD In 8 days. (As scheduled, or earlier if symptoms worsen)    Specialties:  Obstetrics and Gynecology, Radiology   Contact information:   [address]  [address] [place] [address] [phone]       Signed: Tilda Burrow 12/19/2012, 3:50 PM

## 2012-12-24 ENCOUNTER — Telehealth: Payer: Self-pay | Admitting: Obstetrics and Gynecology

## 2012-12-27 ENCOUNTER — Encounter: Payer: Self-pay | Admitting: Obstetrics and Gynecology

## 2012-12-27 ENCOUNTER — Ambulatory Visit (INDEPENDENT_AMBULATORY_CARE_PROVIDER_SITE_OTHER): Payer: Self-pay | Admitting: Obstetrics and Gynecology

## 2012-12-27 VITALS — BP 130/86 | Ht 62.0 in | Wt 139.4 lb

## 2012-12-27 DIAGNOSIS — Z029 Encounter for administrative examinations, unspecified: Secondary | ICD-10-CM

## 2012-12-27 DIAGNOSIS — Z9889 Other specified postprocedural states: Secondary | ICD-10-CM

## 2012-12-27 NOTE — Progress Notes (Signed)
Patient ID: Kathy Stewart, female   DOB: [birth year], 42 y.o.   MRN: [id number] Pt here today for post op. Pt had abdominal hysterectomy on 12/17/12. Pt denies any problems at this time. Pt states that she has noticed some itching at the incision site.  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

## 2012-12-27 NOTE — Patient Instructions (Signed)
Excellent postop care so far !!

## 2013-01-02 ENCOUNTER — Encounter: Payer: Self-pay | Admitting: *Deleted

## 2013-01-15 ENCOUNTER — Telehealth: Payer: Self-pay | Admitting: *Deleted

## 2013-01-15 ENCOUNTER — Ambulatory Visit (INDEPENDENT_AMBULATORY_CARE_PROVIDER_SITE_OTHER): Payer: Self-pay | Admitting: Obstetrics and Gynecology

## 2013-01-15 ENCOUNTER — Encounter: Payer: Self-pay | Admitting: Obstetrics and Gynecology

## 2013-01-15 VITALS — BP 130/84 | Ht 62.5 in | Wt 139.0 lb

## 2013-01-15 DIAGNOSIS — R109 Unspecified abdominal pain: Secondary | ICD-10-CM

## 2013-01-15 DIAGNOSIS — Z9889 Other specified postprocedural states: Secondary | ICD-10-CM

## 2013-01-15 NOTE — Patient Instructions (Signed)
Slow increase in activity,, avoid straining. Use heat pad as needed. Motrin

## 2013-01-15 NOTE — Telephone Encounter (Signed)
Pt states had abdominal supracervical hysterectomy on 12/17/2012. C/o tenderness and "knot under skin." Pt concerned she might have strained herself lifting yesterday. No fever or drainage from incision site. Pt to be here today at 1:30 pm to see Dr. Emelda Fear.

## 2013-01-15 NOTE — Progress Notes (Signed)
Patient ID: Kathy Stewart, female   DOB: March 11, 1971, 42 y.o.   MRN: [id number] Pt states that she lifted some things out of her trunk yesterday and that later she felt a tug and now has a little achy feeling at her incision site. She states that there is also a knot in the same area.  She has been moving items out of car trunk. Now 4 wk s/p hyst.  Physical Examination: General appearance - alert, well appearing, and in no distress Abdomen - soft, nontender, nondistended, no masses or organomegaly Incision has some fibrosis deep in sub-q fat , but no hernia or hematoma or infection. A: normal healing continues, pt advised to reduce abd pressure while lifting, no straining. P heating pad prn.   Slow increase in lifting.

## 2013-01-22 ENCOUNTER — Ambulatory Visit (INDEPENDENT_AMBULATORY_CARE_PROVIDER_SITE_OTHER): Payer: Self-pay | Admitting: Obstetrics and Gynecology

## 2013-01-22 ENCOUNTER — Encounter: Payer: Self-pay | Admitting: Obstetrics and Gynecology

## 2013-01-22 VITALS — BP 130/84 | Ht 62.5 in | Wt 139.0 lb

## 2013-01-22 DIAGNOSIS — Z9889 Other specified postprocedural states: Secondary | ICD-10-CM

## 2013-01-22 NOTE — Patient Instructions (Signed)
Return to work full duty 

## 2013-01-22 NOTE — Progress Notes (Signed)
Patient ID: Kathy Stewart, female   DOB: Mar 15, 1971, 42 y.o.   MRN: [id number] Pt here today for final post op. Pt wants to return to work. Pt has papers with her to be filled out.  Assessment:  Post-Op status post supracervical hysterectomy for fibroids.6 weeks ago.  Doing well postoperatively. To return to wk office work   Plan:  1. Continue any current medications. 2. Wound care discussed. 3. Activity restrictions: none 4. Anticipated return to work: now. 5. Follow up in 1 yr.  Subjective:  Kathy Stewart is a 42 y.o. female who presents to the clinic 6 weeks status post supracervical hysterectomy.  She has been eating a regular diet without difficulty.  Bowel movements are normal. The patient is not having any pain.  Review of Systems Negative except as per HPI   Objective:  BP 130/84  Ht 5' 2.5" (1.588 m)  Wt 139 lb (63.05 kg)  BMI 25 kg/m2  LMP 10/17/2012 General:Well developed, well nourished.  No acute distress. Abdomen: Bowel sounds normal, soft, non-tender. Pelvic Exam: External Genitalia:  Normal. Excoriated due to chronic pad usage, advised to stop, dry vulva regimen reviewed Vagina: Normal Bimanual: Normal Cervix: Normal Uterus: removed above cervix, Adnexa: no masses,Normal Incision(s):Healing well, no drainage, no erythema, no hernia, no swelling, no dehiscence, incision well approximated.    Assessment:  Post-Op status post supracervical hysterectomy for fibroids.6 weeks ago.  Doing well postoperatively.

## 2013-02-26 ENCOUNTER — Encounter (INDEPENDENT_AMBULATORY_CARE_PROVIDER_SITE_OTHER): Payer: Self-pay

## 2013-02-26 ENCOUNTER — Ambulatory Visit: Payer: Self-pay

## 2013-02-26 ENCOUNTER — Ambulatory Visit (INDEPENDENT_AMBULATORY_CARE_PROVIDER_SITE_OTHER): Payer: Self-pay

## 2013-02-26 DIAGNOSIS — Z23 Encounter for immunization: Secondary | ICD-10-CM

## 2013-02-28 ENCOUNTER — Ambulatory Visit: Payer: Self-pay

## 2013-03-31 ENCOUNTER — Ambulatory Visit: Payer: Self-pay | Admitting: Family Medicine

## 2013-05-06 ENCOUNTER — Telehealth: Payer: Self-pay | Admitting: Adult Health

## 2013-05-06 NOTE — Telephone Encounter (Signed)
Samples of aquaphor left at front desk.

## 2013-05-14 ENCOUNTER — Telehealth: Payer: Self-pay

## 2013-05-14 NOTE — Telephone Encounter (Signed)
Try to limit exposure to respiratory secretions, good hygiene.  If she develops influenza symptoms at that ime will be best to be treated on the front end with tamiflu, call back if develops flu symptoms

## 2013-05-15 NOTE — Telephone Encounter (Signed)
Patient aware.

## 2013-05-22 NOTE — Telephone Encounter (Signed)
Done

## 2013-06-25 ENCOUNTER — Other Ambulatory Visit: Payer: Self-pay | Admitting: Family Medicine

## 2013-06-25 DIAGNOSIS — Z1231 Encounter for screening mammogram for malignant neoplasm of breast: Secondary | ICD-10-CM

## 2013-07-03 ENCOUNTER — Telehealth: Payer: Self-pay | Admitting: *Deleted

## 2013-07-03 NOTE — Telephone Encounter (Signed)
error 

## 2013-07-07 ENCOUNTER — Ambulatory Visit (HOSPITAL_COMMUNITY): Payer: Self-pay

## 2013-07-08 ENCOUNTER — Ambulatory Visit (HOSPITAL_COMMUNITY): Payer: Self-pay

## 2013-07-17 ENCOUNTER — Other Ambulatory Visit (HOSPITAL_COMMUNITY): Payer: Self-pay | Admitting: *Deleted

## 2013-07-17 DIAGNOSIS — Z1231 Encounter for screening mammogram for malignant neoplasm of breast: Secondary | ICD-10-CM

## 2013-07-28 ENCOUNTER — Ambulatory Visit (HOSPITAL_COMMUNITY)
Admission: RE | Admit: 2013-07-28 | Discharge: 2013-07-28 | Disposition: A | Discharge status: Home / Self Care | Payer: PRIVATE HEALTH INSURANCE | Source: Ambulatory Visit | Attending: *Deleted | Admitting: *Deleted

## 2013-07-28 DIAGNOSIS — Z1231 Encounter for screening mammogram for malignant neoplasm of breast: Secondary | ICD-10-CM | POA: Insufficient documentation

## 2013-08-01 ENCOUNTER — Other Ambulatory Visit: Payer: Self-pay | Admitting: *Deleted

## 2013-08-01 DIAGNOSIS — R928 Other abnormal and inconclusive findings on diagnostic imaging of breast: Secondary | ICD-10-CM

## 2013-08-06 ENCOUNTER — Ambulatory Visit: Payer: Self-pay

## 2013-08-13 ENCOUNTER — Ambulatory Visit (HOSPITAL_COMMUNITY)
Admission: RE | Admit: 2013-08-13 | Discharge: 2013-08-13 | Disposition: A | Discharge status: Home / Self Care | Payer: PRIVATE HEALTH INSURANCE | Source: Ambulatory Visit | Attending: *Deleted | Admitting: *Deleted

## 2013-08-13 ENCOUNTER — Other Ambulatory Visit: Payer: Self-pay | Admitting: *Deleted

## 2013-08-13 DIAGNOSIS — R928 Other abnormal and inconclusive findings on diagnostic imaging of breast: Secondary | ICD-10-CM

## 2013-08-13 DIAGNOSIS — N63 Unspecified lump in unspecified breast: Secondary | ICD-10-CM | POA: Insufficient documentation

## 2013-12-22 ENCOUNTER — Other Ambulatory Visit (HOSPITAL_COMMUNITY): Payer: Self-pay | Admitting: *Deleted

## 2013-12-22 DIAGNOSIS — N6320 Unspecified lump in the left breast, unspecified quadrant: Secondary | ICD-10-CM

## 2013-12-22 DIAGNOSIS — Z09 Encounter for follow-up examination after completed treatment for conditions other than malignant neoplasm: Secondary | ICD-10-CM

## 2014-01-29 DIAGNOSIS — I10 Essential (primary) hypertension: Secondary | ICD-10-CM

## 2014-01-29 HISTORY — DX: Essential (primary) hypertension: I10

## 2014-02-17 ENCOUNTER — Other Ambulatory Visit (HOSPITAL_COMMUNITY): Payer: Self-pay | Admitting: *Deleted

## 2014-02-17 ENCOUNTER — Encounter (HOSPITAL_COMMUNITY): Payer: Self-pay

## 2014-02-17 ENCOUNTER — Ambulatory Visit (HOSPITAL_COMMUNITY)
Admission: RE | Admit: 2014-02-17 | Discharge: 2014-02-17 | Disposition: A | Discharge status: Home / Self Care | Payer: PRIVATE HEALTH INSURANCE | Source: Ambulatory Visit | Attending: *Deleted | Admitting: *Deleted

## 2014-02-17 DIAGNOSIS — N6320 Unspecified lump in the left breast, unspecified quadrant: Secondary | ICD-10-CM

## 2014-02-17 DIAGNOSIS — Z09 Encounter for follow-up examination after completed treatment for conditions other than malignant neoplasm: Secondary | ICD-10-CM

## 2014-02-17 DIAGNOSIS — N63 Unspecified lump in breast: Secondary | ICD-10-CM | POA: Diagnosis present

## 2014-03-11 ENCOUNTER — Encounter (INDEPENDENT_AMBULATORY_CARE_PROVIDER_SITE_OTHER): Payer: Self-pay

## 2014-03-11 ENCOUNTER — Ambulatory Visit (INDEPENDENT_AMBULATORY_CARE_PROVIDER_SITE_OTHER): Payer: BC Managed Care – PPO

## 2014-03-11 ENCOUNTER — Ambulatory Visit: Payer: BC Managed Care – PPO

## 2014-03-11 DIAGNOSIS — Z23 Encounter for immunization: Secondary | ICD-10-CM

## 2014-03-25 ENCOUNTER — Encounter (HOSPITAL_COMMUNITY): Payer: Self-pay | Admitting: Emergency Medicine

## 2014-03-25 ENCOUNTER — Emergency Department (HOSPITAL_COMMUNITY)
Admission: EM | Admit: 2014-03-25 | Discharge: 2014-03-25 | Disposition: A | Discharge status: Home / Self Care | Payer: BC Managed Care – PPO | Attending: Emergency Medicine | Admitting: Emergency Medicine

## 2014-03-25 DIAGNOSIS — D649 Anemia, unspecified: Secondary | ICD-10-CM | POA: Insufficient documentation

## 2014-03-25 DIAGNOSIS — R200 Anesthesia of skin: Secondary | ICD-10-CM | POA: Diagnosis present

## 2014-03-25 DIAGNOSIS — R202 Paresthesia of skin: Secondary | ICD-10-CM | POA: Diagnosis not present

## 2014-03-25 DIAGNOSIS — Z79899 Other long term (current) drug therapy: Secondary | ICD-10-CM | POA: Insufficient documentation

## 2014-03-25 DIAGNOSIS — Z720 Tobacco use: Secondary | ICD-10-CM | POA: Diagnosis not present

## 2014-03-25 LAB — CBC WITH DIFFERENTIAL/PLATELET
BASOS PCT: 0 % (ref 0–1)
Basophils Absolute: 0 10*3/uL (ref 0.0–0.1)
Eosinophils Absolute: 0.2 10*3/uL (ref 0.0–0.7)
Eosinophils Relative: 3 % (ref 0–5)
HEMATOCRIT: 40 % (ref 36.0–46.0)
HEMOGLOBIN: 13.1 g/dL (ref 12.0–15.0)
LYMPHS ABS: 3 10*3/uL (ref 0.7–4.0)
LYMPHS PCT: 46 % (ref 12–46)
MCH: 29.1 pg (ref 26.0–34.0)
MCHC: 32.8 g/dL (ref 30.0–36.0)
MCV: 88.9 fL (ref 78.0–100.0)
MONO ABS: 0.4 10*3/uL (ref 0.1–1.0)
MONOS PCT: 6 % (ref 3–12)
NEUTROS ABS: 3 10*3/uL (ref 1.7–7.7)
Neutrophils Relative %: 45 % (ref 43–77)
Platelets: 159 10*3/uL (ref 150–400)
RBC: 4.5 MIL/uL (ref 3.87–5.11)
RDW: 12.6 % (ref 11.5–15.5)
WBC: 6.7 10*3/uL (ref 4.0–10.5)

## 2014-03-25 LAB — BASIC METABOLIC PANEL
Anion gap: 14 (ref 5–15)
BUN: 15 mg/dL (ref 6–23)
CHLORIDE: 103 meq/L (ref 96–112)
CO2: 25 meq/L (ref 19–32)
CREATININE: 0.75 mg/dL (ref 0.50–1.10)
Calcium: 9.8 mg/dL (ref 8.4–10.5)
GFR calc Af Amer: >90 mL/min (ref >90)
GFR calc non Af Amer: >90 mL/min (ref >90)
GLUCOSE: 118 mg/dL — AB (ref 70–99)
Potassium: 4.1 mEq/L (ref 3.7–5.3)
Sodium: 142 mEq/L (ref 137–147)

## 2014-03-25 NOTE — ED Provider Notes (Signed)
CSN: [id number]     Arrival date & time 03/25/14  0241 History   First MD Initiated Contact with Patient 03/25/14 0349     Chief Complaint  Patient presents with  . Numbness     (Consider location/radiation/quality/duration/timing/severity/associated sxs/prior Treatment) HPI  This is a 43 year old female who was started on lisinopril about 2 months ago. She complains of a one-month history of paresthesias. The paresthesias are in her fingertips and toes. There is some lesser involvement of her forearms and lower legs. It is most prominent in her right upper extremity. She is here this morning because the symptoms worsened and awakened her from sleep. She got very scared that she might have diabetes and lose a limb so she came to the ED to be evaluated. She is not hyperventilating. Her symptoms are worse at night and improves when she is up and active during the day. The pattern is not consistent with carpal tunnel syndrome and she has been tested for that in the past. She describes the paresthesias as tingling or needles but not frank pain. She denies visual changes, weakness or difficulty with coordination.  Past Medical History  Diagnosis Date  . Anemia    Past Surgical History  Procedure Laterality Date  . Cesarean section    . Tonsillectomy    . Supracervical abdominal hysterectomy N/A 12/17/2012    Procedure: ABDOMINAL SUPRACERVICAL HYSTERECTOMY;  Surgeon: Jonnie Kind, MD;  Location: [facility];  Service: Gynecology;  Laterality: N/A;  . Bilateral salpingectomy Bilateral 12/17/2012    Procedure: BILATERAL SALPINGECTOMY;  Surgeon: Jonnie Kind, MD;  Location: [facility];  Service: Gynecology;  Laterality: Bilateral;  . Salpingoophorectomy Left 12/17/2012    Procedure: SALPINGO OOPHORECTOMY;  Surgeon: Jonnie Kind, MD;  Location: [facility];  Service: Gynecology;  Laterality: Left;   Family History  Problem Relation Age of Onset  . Hypertension Mother   . Kidney disease Mother   .  Cancer Father     bladder  . Hypertension Maternal Grandmother   . Diabetes Maternal Grandmother   . Cataracts Maternal Grandmother    History  Substance Use Topics  . Smoking status: Current Every Day Smoker -- 0.25 packs/day for 17 years    Types: Cigarettes  . Smokeless tobacco: Never Used  . Alcohol Use: Yes     Comment: social   OB History    No data available     Review of Systems  All other systems reviewed and are negative.   Allergies  Oxycodone-acetaminophen  Home Medications   Prior to Admission medications   Medication Sig Start Date End Date Taking? Authorizing Provider  ferrous sulfate 325 (65 FE) MG tablet Take 325 mg by mouth daily with breakfast.   Yes Historical Provider, MD  ibuprofen (ADVIL,MOTRIN) 200 MG tablet Take 200 mg by mouth every 6 (six) hours as needed for pain.   Yes Historical Provider, MD  lisinopril (PRINIVIL,ZESTRIL) 5 MG tablet Take 5 mg by mouth daily.   Yes Historical Provider, MD  traMADol (ULTRAM) 50 MG tablet Take 1 tablet (50 mg total) by mouth every 6 (six) hours as needed for pain. 12/19/12   Jonnie Kind, MD   BP 155/96 mmHg  Pulse 87  Temp(Src) 98 F (36.7 C) (Oral)  Resp 18  Ht 5\' 3"  (1.6 m)  Wt 150 lb (68.04 kg)  BMI 26.58 kg/m2  SpO2 100%  LMP 10/17/2012   Physical Exam  General: Well-developed, well-nourished female in no acute distress; appearance  consistent with age of record HENT: normocephalic; atraumatic Eyes: pupils equal, round and reactive to light; extraocular muscles intact Neck: supple Heart: regular rate and rhythm Lungs: clear to auscultation bilaterally Abdomen: soft; nondistended; nontender; no masses or hepatosplenomegaly; bowel sounds present Extremities: No deformity; full range of motion; pulses normal Neurologic: Awake, alert and oriented; motor function intact in all extremities and symmetric; no facial droop; normal coordination and speech; altered sensation of fingertips and toes; negative  Romberg; normal finger to nose Skin: Warm and dry Psychiatric: Normal mood and affect    ED Course  Procedures (including critical care time)   MDM   Nursing notes and vitals signs, including pulse oximetry, reviewed.  Summary of this visit's results, reviewed by myself:  Labs:  Results for orders placed or performed during the hospital encounter of 03/25/14 (from the past 24 hour(s))  CBC with Differential     Status: None   Collection Time: 03/25/14  3:22 AM  Result Value Ref Range   WBC 6.7 4.0 - 10.5 K/uL   RBC 4.50 3.87 - 5.11 MIL/uL   Hemoglobin 13.1 12.0 - 15.0 g/dL   HCT 40.0 36.0 - 46.0 %   MCV 88.9 78.0 - 100.0 fL   MCH 29.1 26.0 - 34.0 pg   MCHC 32.8 30.0 - 36.0 g/dL   RDW 12.6 11.5 - 15.5 %   Platelets 159 150 - 400 K/uL   Neutrophils Relative % 45 43 - 77 %   Neutro Abs 3.0 1.7 - 7.7 K/uL   Lymphocytes Relative 46 12 - 46 %   Lymphs Abs 3.0 0.7 - 4.0 K/uL   Monocytes Relative 6 3 - 12 %   Monocytes Absolute 0.4 0.1 - 1.0 K/uL   Eosinophils Relative 3 0 - 5 %   Eosinophils Absolute 0.2 0.0 - 0.7 K/uL   Basophils Relative 0 0 - 1 %   Basophils Absolute 0.0 0.0 - 0.1 K/uL  Basic metabolic panel     Status: Abnormal   Collection Time: 03/25/14  3:22 AM  Result Value Ref Range   Sodium 142 137 - 147 mEq/L   Potassium 4.1 3.7 - 5.3 mEq/L   Chloride 103 96 - 112 mEq/L   CO2 25 19 - 32 mEq/L   Glucose, Bld 118 (H) 70 - 99 mg/dL   BUN 15 6 - 23 mg/dL   Creatinine, Ser 0.75 0.50 - 1.10 mg/dL   Calcium 9.8 8.4 - 10.5 mg/dL   GFR calc non Af Amer >90 >90 mL/min   GFR calc Af Amer >90 >90 mL/min   Anion gap 14 5 - 15   4:05 AM Symptoms consistent with a neuropathy. Patient has no history of diabetes. We will have her follow-up with neurology.  Wynetta Fines, MD 03/25/14 [phone]

## 2014-03-25 NOTE — ED Notes (Signed)
Pt c/o chronic numbness in both arms and "needle feelings in my legs".  States these feelings have come and gone for awhile.

## 2014-03-25 NOTE — ED Notes (Signed)
Pt reports "I didn't feel like this before I started taking the Lisinopril 2 months ago".

## 2014-03-25 NOTE — ED Notes (Signed)
MD at bedside. 

## 2014-03-30 ENCOUNTER — Telehealth: Payer: Self-pay | Admitting: Family Medicine

## 2014-03-30 DIAGNOSIS — R200 Anesthesia of skin: Secondary | ICD-10-CM

## 2014-03-30 NOTE — Telephone Encounter (Signed)
Referral entered  

## 2014-03-30 NOTE — Addendum Note (Signed)
Addended by: Eual Fines on: 03/30/2014 03:10 PM   Modules accepted: Orders

## 2014-03-30 NOTE — Telephone Encounter (Signed)
Ok to refer? Tingling and numbess in arms and toes, worse at night. ED recommended referral to [facility]

## 2014-03-30 NOTE — Telephone Encounter (Signed)
Yes I will sign. 

## 2014-04-07 ENCOUNTER — Other Ambulatory Visit: Payer: Self-pay | Admitting: Neurology

## 2014-04-07 DIAGNOSIS — R201 Hypoesthesia of skin: Secondary | ICD-10-CM

## 2014-04-15 ENCOUNTER — Ambulatory Visit (HOSPITAL_COMMUNITY)
Admission: RE | Admit: 2014-04-15 | Discharge: 2014-04-15 | Disposition: A | Discharge status: Home / Self Care | Payer: BC Managed Care – PPO | Source: Ambulatory Visit | Attending: Neurology | Admitting: Neurology

## 2014-04-15 ENCOUNTER — Other Ambulatory Visit (HOSPITAL_COMMUNITY): Payer: BC Managed Care – PPO

## 2014-04-15 DIAGNOSIS — R201 Hypoesthesia of skin: Secondary | ICD-10-CM

## 2014-04-15 DIAGNOSIS — M8938 Hypertrophy of bone, other site: Secondary | ICD-10-CM | POA: Insufficient documentation

## 2014-04-15 DIAGNOSIS — M5032 Other cervical disc degeneration, mid-cervical region: Secondary | ICD-10-CM | POA: Diagnosis not present

## 2014-04-15 DIAGNOSIS — M4802 Spinal stenosis, cervical region: Secondary | ICD-10-CM | POA: Insufficient documentation

## 2014-04-15 DIAGNOSIS — R200 Anesthesia of skin: Secondary | ICD-10-CM | POA: Insufficient documentation

## 2014-04-15 DIAGNOSIS — E042 Nontoxic multinodular goiter: Secondary | ICD-10-CM | POA: Insufficient documentation

## 2014-05-04 ENCOUNTER — Ambulatory Visit: Payer: PRIVATE HEALTH INSURANCE | Admitting: Family Medicine

## 2014-05-04 ENCOUNTER — Encounter: Payer: Self-pay | Admitting: Family Medicine

## 2014-06-04 IMAGING — MG MM DIGITAL DIAGNOSTIC UNILAT*L*
2 series · 2 of 2 positions shown · non-contrast
Comparison: Prior exams dating back to 03/16/2009.

CLINICAL DATA: Patient recalled from screening for left breast
mass.

EXAM:
DIGITAL DIAGNOSTIC  LEFT MAMMOGRAM WITH CAD
ULTRASOUND LEFT BREAST

[L CC]
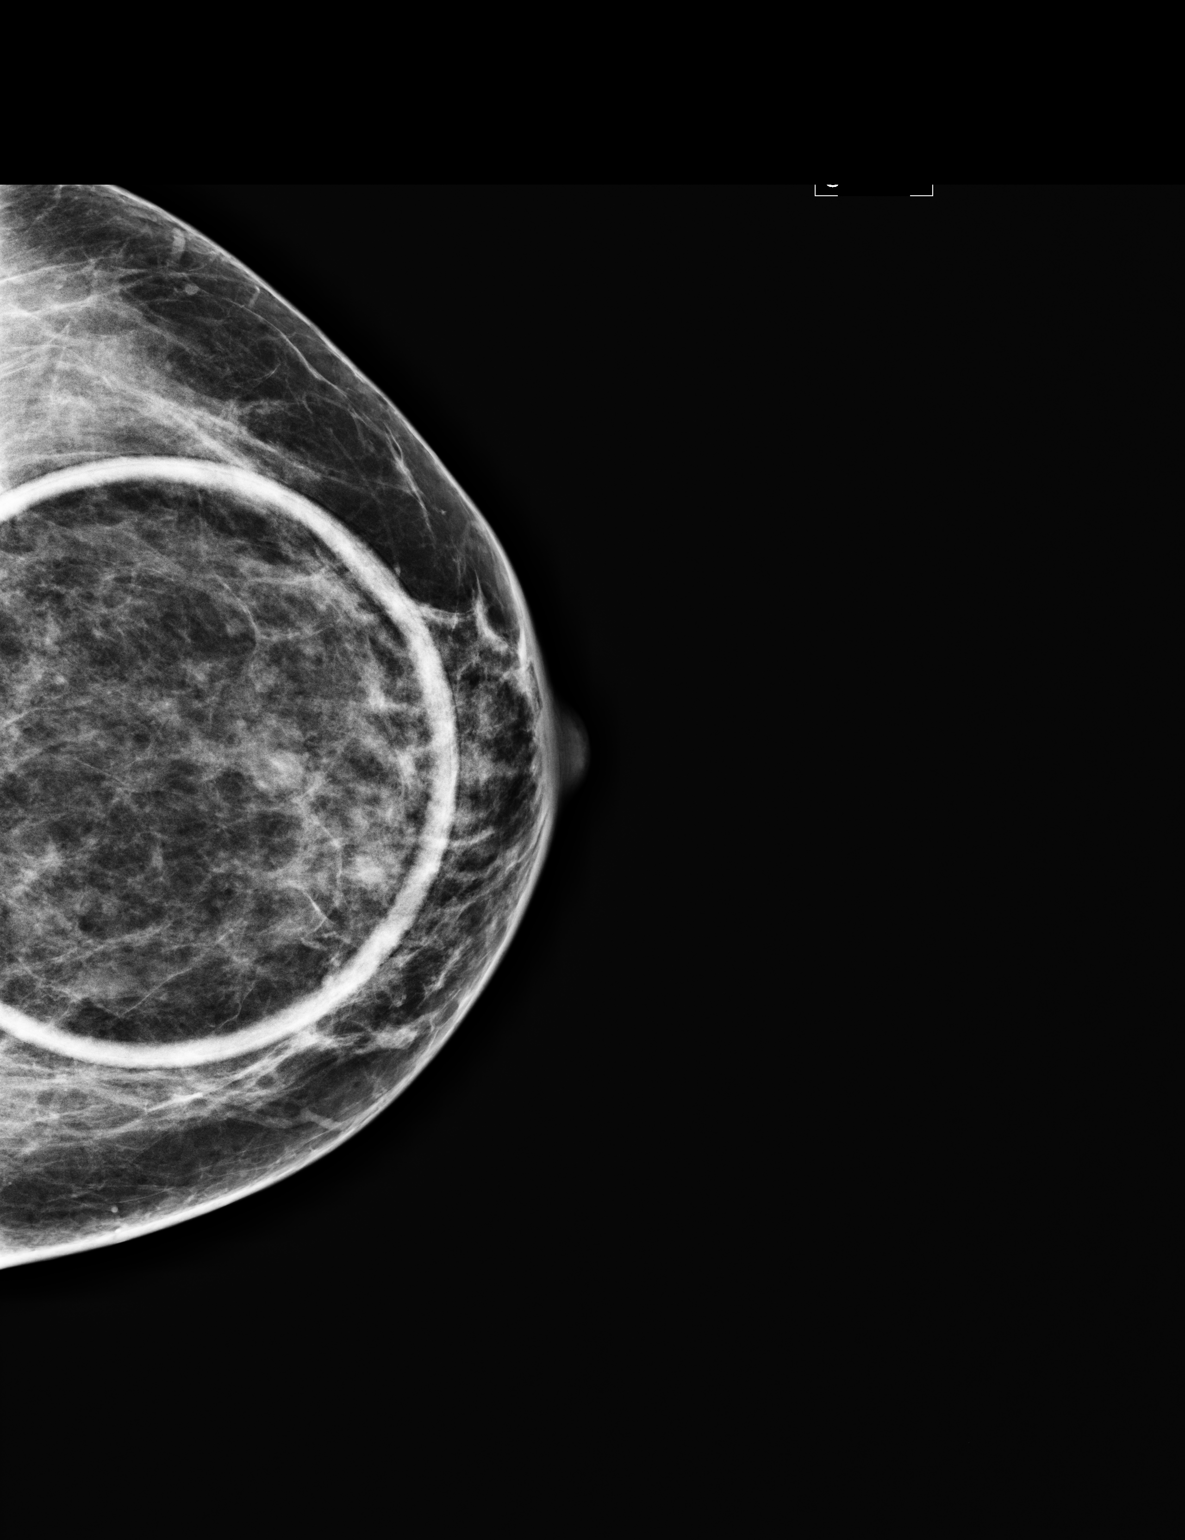

[L ML]
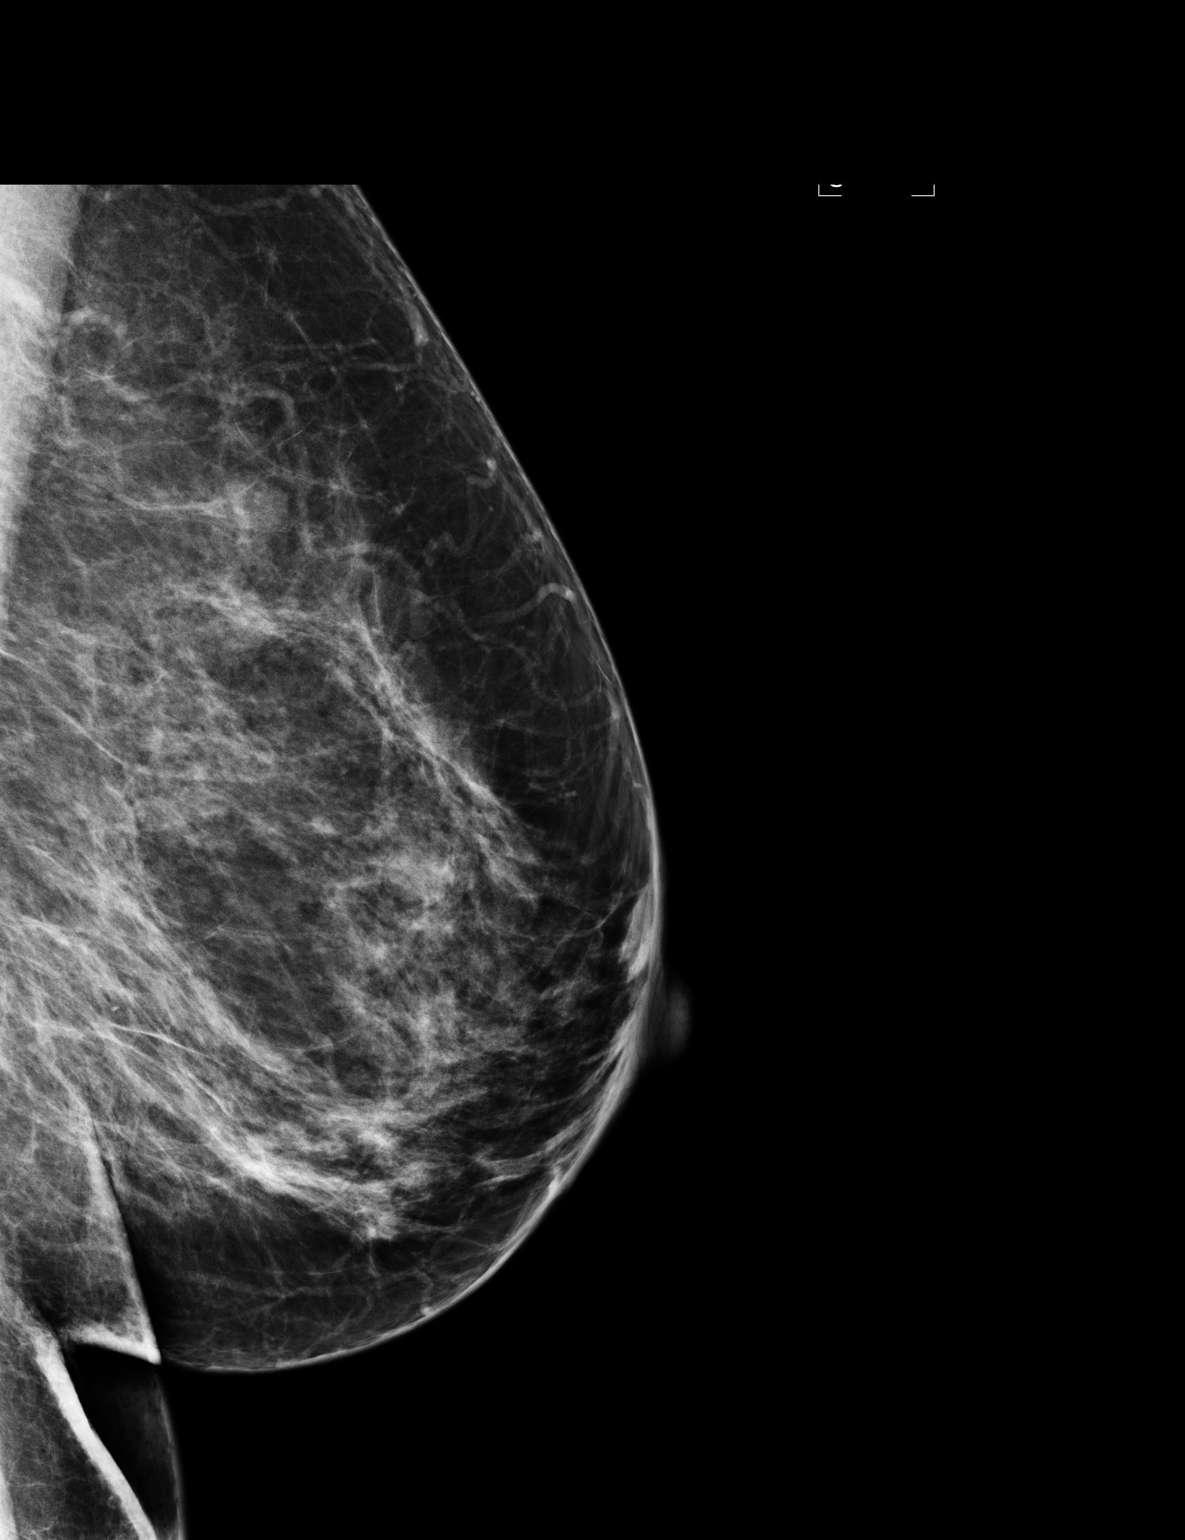

[2 of 2 positions shown; findings below may reference images not displayed]

ACR Breast Density Category c: The breast tissue is heterogeneously
dense, which may obscure small masses.
FINDINGS: Within the central left breast there is a circumscribed 1.5 cm mass.
This may have been present on prior mammograms dating back to
03/16/2009.

Mammographic images were processed with CAD.

On physical exam, I palpate no discrete mass within the central/12
o'clock position left breast.

Ultrasound is performed, showing a 10 x 5 x 9 mm circumscribed oval
hypoechoic mass within the left breast 12 o'clock position 3 cm from
the nipple.
IMPRESSION: Probably benign left breast mass, favored to represent a
fibroadenoma. We discussed management options including excision,
ultrasound-guided core biopsy, and short term interval follow-up.
Follow-up ultrasound is recommended at 6, 12,and 24 months to assess
stability. The patient concurs with this plan.

RECOMMENDATION:
Left breast diagnostic mammogram and ultrasound in 6 months.

I have discussed the findings and recommendations with the patient.
Results were also provided in writing at the conclusion of the
visit. If applicable, a reminder letter will be sent to the patient
regarding the next appointment.

BI-RADS CATEGORY  3: Probably benign.

## 2014-06-11 ENCOUNTER — Ambulatory Visit (INDEPENDENT_AMBULATORY_CARE_PROVIDER_SITE_OTHER): Payer: BC Managed Care – PPO

## 2014-06-11 VITALS — Wt 159.0 lb

## 2014-06-11 DIAGNOSIS — Z23 Encounter for immunization: Secondary | ICD-10-CM

## 2014-06-11 NOTE — Progress Notes (Signed)
Cut her finger and tdap was due. Received in the office with no complications

## 2014-07-23 ENCOUNTER — Other Ambulatory Visit: Payer: Self-pay | Admitting: Family Medicine

## 2014-07-23 DIAGNOSIS — Z09 Encounter for follow-up examination after completed treatment for conditions other than malignant neoplasm: Secondary | ICD-10-CM

## 2014-07-23 DIAGNOSIS — N6320 Unspecified lump in the left breast, unspecified quadrant: Secondary | ICD-10-CM

## 2014-07-29 ENCOUNTER — Ambulatory Visit: Payer: BC Managed Care – PPO | Admitting: Family Medicine

## 2014-08-04 ENCOUNTER — Ambulatory Visit: Payer: BC Managed Care – PPO | Admitting: Family Medicine

## 2014-08-05 ENCOUNTER — Other Ambulatory Visit: Payer: Self-pay | Admitting: Family Medicine

## 2014-08-05 ENCOUNTER — Ambulatory Visit (INDEPENDENT_AMBULATORY_CARE_PROVIDER_SITE_OTHER): Payer: BC Managed Care – PPO | Admitting: Family Medicine

## 2014-08-05 ENCOUNTER — Encounter: Payer: Self-pay | Admitting: Family Medicine

## 2014-08-05 VITALS — BP 134/80 | HR 88 | Resp 18 | Ht 62.5 in | Wt 159.0 lb

## 2014-08-05 DIAGNOSIS — J302 Other seasonal allergic rhinitis: Secondary | ICD-10-CM | POA: Insufficient documentation

## 2014-08-05 DIAGNOSIS — N6320 Unspecified lump in the left breast, unspecified quadrant: Secondary | ICD-10-CM

## 2014-08-05 DIAGNOSIS — N63 Unspecified lump in breast: Secondary | ICD-10-CM

## 2014-08-05 DIAGNOSIS — R7309 Other abnormal glucose: Secondary | ICD-10-CM

## 2014-08-05 DIAGNOSIS — E663 Overweight: Secondary | ICD-10-CM

## 2014-08-05 DIAGNOSIS — I10 Essential (primary) hypertension: Secondary | ICD-10-CM | POA: Diagnosis not present

## 2014-08-05 DIAGNOSIS — F17218 Nicotine dependence, cigarettes, with other nicotine-induced disorders: Secondary | ICD-10-CM | POA: Diagnosis not present

## 2014-08-05 DIAGNOSIS — F17200 Nicotine dependence, unspecified, uncomplicated: Secondary | ICD-10-CM

## 2014-08-05 DIAGNOSIS — J301 Allergic rhinitis due to pollen: Secondary | ICD-10-CM

## 2014-08-05 DIAGNOSIS — R0602 Shortness of breath: Secondary | ICD-10-CM

## 2014-08-05 DIAGNOSIS — Z720 Tobacco use: Secondary | ICD-10-CM | POA: Diagnosis not present

## 2014-08-05 DIAGNOSIS — R7303 Prediabetes: Secondary | ICD-10-CM

## 2014-08-05 MED ORDER — ATENOLOL 25 MG PO TABS: 25.0000 mg | ORAL_TABLET | Freq: Every day | ORAL | Status: DC

## 2014-08-05 MED ORDER — MOMETASONE FUROATE 50 MCG/ACT NA SUSP: 2.0000 | Freq: Every day | NASAL | Status: DC

## 2014-08-05 NOTE — Progress Notes (Signed)
Subjective:    Patient ID: Kathy Stewart, female    DOB: 1970/12/22, 44 y.o.   MRN: [id number]  HPI The PT is here fto re establish care and follow up f chronic medical conditions, medication management and review of any available recent lab and radiology data.  Preventive health is updated, specifically  Cancer screening and Immunization.    The PT denies any adverse reactions to current medications since the last visit.  Is aware of the need to stop smoking and fells as though ready to quit, after  counseling at the visit ,  She is determined to do so, but no date set at this time Has been told that blood sugar was sligthly high nad will send for labs from HD      Review of Systems See HPI Denies recent fever or chills. Denies sinus pressure, nasal congestion, ear pain or sore throat.Increased watery eyes and clear nasal drainage Denies chest congestion, productive cough or wheezing. Denies chest pains, palpitations and leg swelling Denies abdominal pain, nausea, vomiting,diarrhea or constipation.   Denies dysuria, frequency, hesitancy or incontinence. Denies joint pain, swelling and limitation in mobility. Denies headaches, seizures, numbness, or tingling. Denies depression, anxiety or insomnia.Does admit to overindulgence in beer, which she believes is partly responsible for elevated blood sugar and weight gain, will work on this Denies skin break down or rash.        Objective:   Physical Exam BP 134/80 mmHg  Pulse 88  Resp 18  Ht 5' 2.5" (1.588 m)  Wt 159 lb 0.6 oz (72.14 kg)  BMI 28.61 kg/m2  SpO2 98%  LMP 10/17/2012  Patient alert and oriented and in no cardiopulmonary distress.  HEENT: No facial asymmetry, EOMI,   oropharynx pink and moist.  Neck supple no JVD, no mass.  Chest: Clear to auscultation bilaterally.  CVS: S1, S2 no murmurs, no S3.Regular rate.  ABD: Soft non tender.   Ext: No edema  MS: Adequate ROM spine, shoulders, hips and  knees.  Skin: Intact, no ulcerations or rash noted.  Psych: Good eye contact, normal affect. Memory intact not anxious or depressed appearing.  CNS: CN 2-12 intact, power,  normal throughout.no focal deficits noted.       Assessment & Plan:  Essential hypertension Controlled, no change in medication DASH diet and commitment to daily physical activity for a minimum of 30 minutes discussed and encouraged, as a part of hypertension management. The importance of attaining a healthy weight is also discussed.  BP/Weight 08/05/2014 06/11/2014 04/15/2014 03/25/2014 01/22/2013 01/15/2013 12/17/2991  Systolic BP 716 - - 967 893 810 175  Diastolic BP 80 - - 84 84 84 86  Wt. (Lbs) 159.04 159 50 150 139 139 139.4  BMI 28.61 29.07 9.14 26.58 25 25 25.49         NICOTINE ADDICTION Patient counseled for approximately 5 minutes regarding the health risks of ongoing nicotine use, specifically all types of cancer, heart disease, stroke and respiratory failure. The options available for help with cessation ,the behavioral changes to assist the process, and the option to either gradully reduce usage  Or abruptly stop.is also discussed. Pt is also encouraged to set specific goals in number of cigarettes used daily, as well as to set a quit date.  Number of cigarettes/cigars currently smoking daily: 5    Seasonal allergies Increased symptoms with season change, start daily nasal steroid, same prescribed, she has benefited int he past   Prediabetes Patient educated about  the importance of limiting  Carbohydrate intake , the need to commit to daily physical activity for a minimum of 30 minutes , and to commit weight loss. The fact that changes in all these areas will reduce or eliminate all together the development of diabetes is stressed.   Diabetic Labs Latest Ref Rng 03/25/2014 12/17/2012 12/13/2012 10/16/2010 03/21/2008  HbA1c 4.6-6.1 % - - - - -  Chol 0-200 mg/dL - - - - 178  HDL >39 mg/dL - - -  - 67  Calc LDL 0-99 mg/dL - - - - 97  Triglycerides <150 mg/dL - - - - 69  Creatinine 0.50 - 1.10 mg/dL 0.75 0.64 0.69 0.64 0.73   BP/Weight 08/05/2014 06/11/2014 04/15/2014 03/25/2014 01/22/2013 01/15/2013 5/32/0233  Systolic BP 435 - - 686 168 372 902  Diastolic BP 80 - - 84 84 84 86  Wt. (Lbs) 159.04 159 50 150 139 139 139.4  BMI 28.61 29.07 9.14 26.58 25 25 25.49   No flowsheet data found. Updated lab needed and lipid profile     Overweight (BMI 25.0-29.9) Improved Patient re-educated about  the importance of commitment to a  minimum of 150 minutes of exercise per week.  The importance of healthy food choices with portion control discussed. Encouraged to start a food diary, count calories and to consider  joining a support group. Sample diet sheets offered. Goals set by the patient for the next several months.   Weight /BMI 08/05/2014 06/11/2014 04/15/2014  WEIGHT 159 lb 0.6 oz 159 lb 50 lb  HEIGHT 5' 2.5" - 5\' 2"   BMI 28.61 kg/m2 29.07 kg/m2 9.14 kg/m2    Current exercise per week 60 minutes.

## 2014-08-05 NOTE — Patient Instructions (Signed)
CPE with pap in 4 month, call if you need me before  Please work on changing habits as we discussed to improve your health, one day at a time  You DO NEED to stop smoking, your breathing will improve, and so will your blood pressure, info on classes to help , and other info will be provided.   Pls sign for recent  Labs from health dept  Weight loss goal of 10 pounds  Thanks for choosing [facility], we consider it a privelige to serve you.

## 2014-08-11 ENCOUNTER — Other Ambulatory Visit: Payer: BC Managed Care – PPO

## 2014-08-11 DIAGNOSIS — E663 Overweight: Secondary | ICD-10-CM | POA: Insufficient documentation

## 2014-08-11 DIAGNOSIS — R7303 Prediabetes: Secondary | ICD-10-CM | POA: Insufficient documentation

## 2014-08-11 NOTE — Assessment & Plan Note (Signed)
Controlled, no change in medication DASH diet and commitment to daily physical activity for a minimum of 30 minutes discussed and encouraged, as a part of hypertension management. The importance of attaining a healthy weight is also discussed.  BP/Weight 08/05/2014 06/11/2014 04/15/2014 03/25/2014 01/22/2013 01/15/2013 12/31/2222  Systolic BP 114 - - 643 142 767 011  Diastolic BP 80 - - 84 84 84 86  Wt. (Lbs) 159.04 159 50 150 139 139 139.4  BMI 28.61 29.07 9.14 26.58 25 25 25.49

## 2014-08-11 NOTE — Assessment & Plan Note (Signed)
Increased symptoms with season change, start daily nasal steroid, same prescribed, she has benefited int he past

## 2014-08-11 NOTE — Assessment & Plan Note (Signed)

## 2014-08-11 NOTE — Assessment & Plan Note (Signed)
Improved Patient re-educated about  the importance of commitment to a  minimum of 150 minutes of exercise per week.  The importance of healthy food choices with portion control discussed. Encouraged to start a food diary, count calories and to consider  joining a support group. Sample diet sheets offered. Goals set by the patient for the next several months.   Weight /BMI 08/05/2014 06/11/2014 04/15/2014  WEIGHT 159 lb 0.6 oz 159 lb 50 lb  HEIGHT 5' 2.5" - 5\' 2"   BMI 28.61 kg/m2 29.07 kg/m2 9.14 kg/m2    Current exercise per week 60 minutes.

## 2014-08-11 NOTE — Assessment & Plan Note (Signed)
Patient educated about the importance of limiting  Carbohydrate intake , the need to commit to daily physical activity for a minimum of 30 minutes , and to commit weight loss. The fact that changes in all these areas will reduce or eliminate all together the development of diabetes is stressed.   Diabetic Labs Latest Ref Rng 03/25/2014 12/17/2012 12/13/2012 10/16/2010 03/21/2008  HbA1c 4.6-6.1 % - - - - -  Chol 0-200 mg/dL - - - - 178  HDL >39 mg/dL - - - - 67  Calc LDL 0-99 mg/dL - - - - 97  Triglycerides <150 mg/dL - - - - 69  Creatinine 0.50 - 1.10 mg/dL 0.75 0.64 0.69 0.64 0.73   BP/Weight 08/05/2014 06/11/2014 04/15/2014 03/25/2014 01/22/2013 01/15/2013 10/18/3557  Systolic BP 741 - - 638 453 646 803  Diastolic BP 80 - - 84 84 84 86  Wt. (Lbs) 159.04 159 50 150 139 139 139.4  BMI 28.61 29.07 9.14 26.58 25 25 25.49   No flowsheet data found. Updated lab needed and lipid profile

## 2014-08-12 ENCOUNTER — Telehealth: Payer: Self-pay

## 2014-08-12 DIAGNOSIS — I10 Essential (primary) hypertension: Secondary | ICD-10-CM

## 2014-08-12 DIAGNOSIS — Z13220 Encounter for screening for lipoid disorders: Secondary | ICD-10-CM

## 2014-08-12 DIAGNOSIS — R7303 Prediabetes: Secondary | ICD-10-CM

## 2014-08-12 NOTE — Telephone Encounter (Signed)
-----   Message from Fayrene Helper, MD sent at 08/11/2014  6:00 PM EDT ----- Regarding: labs needed pls Pls let pt know I got her labs and notes from [facility]  Needs fasting lipid, chem 7 and HBa1C in the next 1 week She is prediabetic,and no lipid panel done then, other dx to use is HTN Let her know kidney, liver and thyroid function normal and she is not anemic , thanks  ?? pls ask

## 2014-08-12 NOTE — Telephone Encounter (Signed)
Spoke with patient and she is aware of need for labs. Lab order mailed to address on file verified with patient.

## 2014-08-14 LAB — HEMOGLOBIN A1C: A1c: 6.1

## 2014-08-21 ENCOUNTER — Other Ambulatory Visit: Payer: BC Managed Care – PPO

## 2014-09-08 ENCOUNTER — Encounter (HOSPITAL_COMMUNITY): Payer: BC Managed Care – PPO

## 2014-09-15 ENCOUNTER — Ambulatory Visit (HOSPITAL_COMMUNITY)
Admission: RE | Admit: 2014-09-15 | Discharge: 2014-09-15 | Disposition: A | Discharge status: Home / Self Care | Payer: BC Managed Care – PPO | Source: Ambulatory Visit | Attending: Family Medicine | Admitting: Family Medicine

## 2014-09-15 DIAGNOSIS — N63 Unspecified lump in breast: Secondary | ICD-10-CM | POA: Diagnosis present

## 2014-09-15 DIAGNOSIS — Z09 Encounter for follow-up examination after completed treatment for conditions other than malignant neoplasm: Secondary | ICD-10-CM | POA: Diagnosis present

## 2014-09-15 DIAGNOSIS — N6320 Unspecified lump in the left breast, unspecified quadrant: Secondary | ICD-10-CM

## 2014-11-11 ENCOUNTER — Telehealth: Payer: Self-pay | Admitting: *Deleted

## 2014-11-11 DIAGNOSIS — R7303 Prediabetes: Secondary | ICD-10-CM

## 2014-11-11 DIAGNOSIS — I10 Essential (primary) hypertension: Secondary | ICD-10-CM

## 2014-11-11 NOTE — Telephone Encounter (Signed)
Pt called to change her address and said that her lab order has expired pt said she needs a new lab order mailed to her and her address is [address] [place] [address]

## 2014-11-12 NOTE — Telephone Encounter (Signed)
Labs ordered and mailed to listed address.

## 2014-12-03 LAB — LIPID PANEL
CHOL/HDL RATIO: 3.7 ratio (ref <=5.0)
CHOLESTEROL: 184 mg/dL (ref 125–200)
HDL: 50 mg/dL (ref >=46)
LDL CALC: 104 mg/dL (ref <130)
TRIGLYCERIDES: 152 mg/dL — AB (ref <150)
VLDL: 30 mg/dL (ref <30)

## 2014-12-03 LAB — BASIC METABOLIC PANEL
BUN: 10 mg/dL (ref 7–25)
CO2: 26 mmol/L (ref 20–31)
CREATININE: 0.61 mg/dL (ref 0.50–1.10)
Calcium: 9.4 mg/dL (ref 8.6–10.2)
Chloride: 106 mmol/L (ref 98–110)
Glucose, Bld: 94 mg/dL (ref 65–99)
POTASSIUM: 4.2 mmol/L (ref 3.5–5.3)
SODIUM: 142 mmol/L (ref 135–146)

## 2014-12-03 LAB — HEMOGLOBIN A1C
Hgb A1c MFr Bld: 5.8 % — ABNORMAL HIGH (ref <5.7)
Mean Plasma Glucose: 120 mg/dL — ABNORMAL HIGH (ref <117)

## 2014-12-08 ENCOUNTER — Telehealth: Payer: Self-pay | Admitting: *Deleted

## 2014-12-08 NOTE — Telephone Encounter (Signed)
Pt called requesting her lab results. Please advise 

## 2014-12-08 NOTE — Telephone Encounter (Signed)
Patient aware of results.

## 2015-01-11 ENCOUNTER — Telehealth: Payer: Self-pay | Admitting: *Deleted

## 2015-01-11 NOTE — Telephone Encounter (Signed)
Tired to call her back to see if she wanted me to order the lab to be done before the visit but no answer. Will try back again

## 2015-01-11 NOTE — Telephone Encounter (Signed)
Pt said she would like to have her iron checked, pt said she didn't get to have that checked last time pt said this can be discussed at her office visit. 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

## 2015-01-14 ENCOUNTER — Other Ambulatory Visit (HOSPITAL_COMMUNITY)
Admission: RE | Admit: 2015-01-14 | Discharge: 2015-01-14 | Disposition: A | Discharge status: Home / Self Care | Payer: BC Managed Care – PPO | Source: Ambulatory Visit | Attending: Family Medicine | Admitting: Family Medicine

## 2015-01-14 ENCOUNTER — Encounter: Payer: Self-pay | Admitting: Family Medicine

## 2015-01-14 ENCOUNTER — Ambulatory Visit (INDEPENDENT_AMBULATORY_CARE_PROVIDER_SITE_OTHER): Payer: BC Managed Care – PPO | Admitting: Family Medicine

## 2015-01-14 VITALS — BP 122/82 | HR 76 | Resp 16 | Ht 63.0 in | Wt 158.0 lb

## 2015-01-14 DIAGNOSIS — Z124 Encounter for screening for malignant neoplasm of cervix: Secondary | ICD-10-CM

## 2015-01-14 DIAGNOSIS — Z1151 Encounter for screening for human papillomavirus (HPV): Secondary | ICD-10-CM | POA: Insufficient documentation

## 2015-01-14 DIAGNOSIS — R7309 Other abnormal glucose: Secondary | ICD-10-CM

## 2015-01-14 DIAGNOSIS — Z0000 Encounter for general adult medical examination without abnormal findings: Secondary | ICD-10-CM | POA: Diagnosis not present

## 2015-01-14 DIAGNOSIS — Z01419 Encounter for gynecological examination (general) (routine) without abnormal findings: Secondary | ICD-10-CM | POA: Diagnosis present

## 2015-01-14 DIAGNOSIS — Z1211 Encounter for screening for malignant neoplasm of colon: Secondary | ICD-10-CM | POA: Diagnosis not present

## 2015-01-14 DIAGNOSIS — I10 Essential (primary) hypertension: Secondary | ICD-10-CM | POA: Diagnosis not present

## 2015-01-14 DIAGNOSIS — R7303 Prediabetes: Secondary | ICD-10-CM

## 2015-01-14 DIAGNOSIS — Z13220 Encounter for screening for lipoid disorders: Secondary | ICD-10-CM

## 2015-01-14 DIAGNOSIS — F17200 Nicotine dependence, unspecified, uncomplicated: Secondary | ICD-10-CM

## 2015-01-14 DIAGNOSIS — Z720 Tobacco use: Secondary | ICD-10-CM

## 2015-01-14 LAB — POC HEMOCCULT BLD/STL (OFFICE/1-CARD/DIAGNOSTIC): FECAL OCCULT BLD: NEGATIVE

## 2015-01-14 NOTE — Progress Notes (Signed)
   Subjective:    Patient ID: Kathy Stewart, female    DOB: 01-06-1971, 44 y.o.   MRN: [id number]  HPI    Review of Systems     Objective:   Physical Exam GU: Uterus absent, no adnexal masses. Cervix present and appears healthy No ulceration or lesions noted in vaginal wall or on cervix. Female distribution of pelvic hair. No inguinal adenopathy       Assessment & Plan:

## 2015-01-14 NOTE — Patient Instructions (Addendum)
F/u in 5 month, call if you need me before   Continue to work on habits to improve your health, we are ALL "works in progress"  No changes in medication  rememebr to work on cutting back with a plan to quitting both cigarettes and alcohol, you CAN do both.  CBC, fasting lipdi, chem 7 HBa1C and TSH in 5 month, TSH   Please work on good  health habits so that your health will improve. 1. Commitment to daily physical activity for 30 to 60  minutes, if you are able to do this.  2. Commitment to wise food choices. Aim for half of your  food intake to be vegetable and fruit, one quarter starchy foods, and one quarter protein. Try to eat on a regular schedule  3 meals per day, snacking between meals should be limited to vegetables or fruits or small portions of nuts. 64 ounces of water per day is generally recommended, unless you have specific health conditions, like heart failure or kidney failure where you will need to limit fluid intake.  3. Commitment to sufficient and a  good quality of physical and mental rest daily, generally between 6 to 8 hours per day.  WITH PERSISTANCE AND PERSEVERANCE, THE IMPOSSIBLE , BECOMES THE NORM!  Thanks for choosing [facility], we consider it a privelige to serve you.

## 2015-01-14 NOTE — Progress Notes (Signed)
   Subjective:    Patient ID: Kathy Stewart, female    DOB: [birth year], 44 y.o.   MRN: [id number]  HPI Patient is in for annual physical exam. No other health concerns are expressed or addressed at the visit. Recent labs, if available are reviewed. Immunization is reviewed , and  updated if needed.Chose to hold on flu vaccine    Review of Systems See HPI     Objective:   Physical Exam BP 122/82 mmHg  Pulse 76  Resp 16  Ht 5\' 3"  (1.6 m)  Wt 158 lb (71.668 kg)  BMI 28.00 kg/m2  SpO2 98%  LMP 10/17/2012 Pleasant well nourished female, alert and oriented x 3, in no cardio-pulmonary distress. Afebrile. HEENT No facial trauma or asymetry. Sinuses non tender.  Extra occullar muscles intact, pupils equally reactive to light. External ears normal, tympanic membranes clear. Oropharynx moist, no exudate, fairly  good dentition. Neck: supple, no adenopathy,JVD or thyromegaly.No bruits.  Chest: Clear to ascultation bilaterally.No crackles or wheezes. Non tender to palpation  Breast: No asymetry,no masses or lumps. No tenderness. No nipple discharge or inversion. No axillary or supraclavicular adenopathy  Cardiovascular system; Heart sounds normal,  S1 and  S2 ,no S3.  No murmur, or thrill. Apical beat not displaced Peripheral pulses normal.  Abdomen: Soft, non tender, no organomegaly or masses. No bruits. Bowel sounds normal. No guarding, tenderness or rebound.  Rectal:  Normal sphincter tone. No mass.No rectal masses.  Guaiac negative stool.  GU: External genitalia normal female genitalia , female distribution of hair. No lesions. Urethral meatus normal in size, no  Prolapse, no lesions visibly  Present. Bladder non tender. Vagina pink and moist , with no visible lesions , discharge present . Adequate pelvic support no  cystocele or rectocele noted Cervix pink and appears healthy, no lesions or ulcerations noted, no discharge noted from os Uterus normal size,  no adnexal masses, no cervical motion or adnexal tenderness.   Musculoskeletal exam: Full ROM of spine, hips , shoulders and knees. No deformity ,swelling or crepitus noted. No muscle wasting or atrophy.   Neurologic: Cranial nerves 2 to 12 intact. Power, tone ,sensation and reflexes normal throughout. No disturbance in gait. No tremor.  Skin: Intact, no ulceration, erythema , scaling or rash noted. Pigmentation normal throughout  Psych; Normal mood and affect. Judgement and concentration normal        Assessment & Plan:  Annual physical exam Annual exam as documented. Counseling done  re healthy lifestyle involving commitment to 150 minutes exercise per week, heart healthy diet, and attaining healthy weight.The importance of adequate sleep also discussed. Regular seat belt use and home safety, is also discussed. Changes in health habits are decided on by the patient with goals and time frames  set for achieving them. Immunization and cancer screening needs are specifically addressed at this visit.   NICOTINE ADDICTION Patient counseled for approximately 5 minutes regarding the health risks of ongoing nicotine use, specifically all types of cancer, heart disease, stroke and respiratory failure. The options available for help with cessation ,the behavioral changes to assist the process, and the option to either gradully reduce usage  Or abruptly stop.is also discussed. Pt is also encouraged to set specific goals in number of cigarettes used daily, as well as to set a quit date.  Number of cigarettes/cigars currently smoking daily: 7

## 2015-01-14 NOTE — Assessment & Plan Note (Signed)

## 2015-01-14 NOTE — Addendum Note (Signed)
Addended by: Fayrene Helper on: 01/14/2015 08:49 PM   Modules accepted: Miquel Dunn

## 2015-01-14 NOTE — Assessment & Plan Note (Signed)

## 2015-01-19 LAB — CYTOLOGY - PAP

## 2015-02-13 ENCOUNTER — Other Ambulatory Visit: Payer: Self-pay | Admitting: Family Medicine

## 2015-02-15 ENCOUNTER — Other Ambulatory Visit: Payer: Self-pay

## 2015-02-15 DIAGNOSIS — I10 Essential (primary) hypertension: Secondary | ICD-10-CM

## 2015-02-15 MED ORDER — ATENOLOL 25 MG PO TABS: 25.0000 mg | ORAL_TABLET | Freq: Every day | ORAL | Status: DC

## 2015-03-09 ENCOUNTER — Ambulatory Visit (INDEPENDENT_AMBULATORY_CARE_PROVIDER_SITE_OTHER): Payer: BC Managed Care – PPO

## 2015-03-09 DIAGNOSIS — Z23 Encounter for immunization: Secondary | ICD-10-CM | POA: Diagnosis not present

## 2015-03-17 ENCOUNTER — Other Ambulatory Visit: Payer: Self-pay | Admitting: Family Medicine

## 2015-04-09 ENCOUNTER — Telehealth: Payer: Self-pay | Admitting: Family Medicine

## 2015-04-09 MED ORDER — ACYCLOVIR 200 MG PO CAPS: 400.0000 mg | ORAL_CAPSULE | Freq: Two times a day (BID) | ORAL | Status: DC

## 2015-04-09 NOTE — Telephone Encounter (Signed)
Med refilled.

## 2015-04-09 NOTE — Telephone Encounter (Signed)
Patient is asking for a refill onacyclovir (ZOVIRAX) 200 MG capsule

## 2015-06-21 ENCOUNTER — Telehealth: Payer: Self-pay | Admitting: Family Medicine

## 2015-06-21 NOTE — Telephone Encounter (Signed)
Kathy Stewart is calling asking if she is due any fasting labs, she is going to be loosing her insurance and she wants to go ahead and get lab work done if she is due, please advise?

## 2015-06-21 NOTE — Telephone Encounter (Signed)
Message left that lab order was faxed over for fasting labs

## 2015-06-24 ENCOUNTER — Encounter: Payer: Self-pay | Admitting: Family Medicine

## 2015-06-24 ENCOUNTER — Ambulatory Visit (INDEPENDENT_AMBULATORY_CARE_PROVIDER_SITE_OTHER): Payer: BC Managed Care – PPO | Admitting: Family Medicine

## 2015-06-24 VITALS — BP 150/88 | HR 62 | Resp 16 | Ht 63.0 in | Wt 158.0 lb

## 2015-06-24 DIAGNOSIS — G4700 Insomnia, unspecified: Secondary | ICD-10-CM

## 2015-06-24 DIAGNOSIS — I10 Essential (primary) hypertension: Secondary | ICD-10-CM | POA: Diagnosis not present

## 2015-06-24 DIAGNOSIS — M5090 Cervical disc disorder, unspecified, unspecified cervical region: Secondary | ICD-10-CM

## 2015-06-24 DIAGNOSIS — F17200 Nicotine dependence, unspecified, uncomplicated: Secondary | ICD-10-CM

## 2015-06-24 DIAGNOSIS — E663 Overweight: Secondary | ICD-10-CM

## 2015-06-24 DIAGNOSIS — R7303 Prediabetes: Secondary | ICD-10-CM

## 2015-06-24 MED ORDER — METHYLPREDNISOLONE ACETATE 80 MG/ML IJ SUSP
80.0000 mg | Freq: Once | INTRAMUSCULAR | Status: AC
  Administered 2015-06-24: 80 mg via INTRAMUSCULAR

## 2015-06-24 MED ORDER — PREDNISONE 5 MG PO TABS: 5.0000 mg | ORAL_TABLET | Freq: Two times a day (BID) | ORAL | Status: AC

## 2015-06-24 MED ORDER — KETOROLAC TROMETHAMINE 60 MG/2ML IM SOLN
60.0000 mg | Freq: Once | INTRAMUSCULAR | Status: AC
  Administered 2015-06-24: 60 mg via INTRAMUSCULAR

## 2015-06-24 MED ORDER — GABAPENTIN 100 MG PO CAPS: 100.0000 mg | ORAL_CAPSULE | Freq: Every day | ORAL | Status: DC

## 2015-06-24 MED ORDER — DICLOFENAC SODIUM 1 % TD GEL: 2.0000 g | Freq: Four times a day (QID) | TRANSDERMAL | Status: DC

## 2015-06-24 NOTE — Assessment & Plan Note (Signed)
Uncontrolled, non compliant, recheck  By nurse in 6 weeks  DASH diet and commitment to daily physical activity for a minimum of 30 minutes discussed and encouraged, as a part of hypertension management. The importance of attaining a healthy weight is also discussed.  BP/Weight 06/24/2015 01/14/2015 08/05/2014 06/11/2014 04/15/2014 03/25/2014 0000000  Systolic BP Q000111Q 123XX123 Q000111Q - - Q000111Q [id number]  Diastolic BP 88 82 80 - - 84 84  Wt. (Lbs) 158 158 159.04 159 50 150 139  BMI 28 28 28.61 29.07 9.14 26.58 25

## 2015-06-24 NOTE — Assessment & Plan Note (Signed)
Unchanged Patient re-educated about  the importance of commitment to a  minimum of 150 minutes of exercise per week.  The importance of healthy food choices with portion control discussed. Encouraged to start a food diary, count calories and to consider  joining a support group. Sample diet sheets offered. Goals set by the patient for the next several months.   Weight /BMI 06/24/2015 01/14/2015 08/05/2014  WEIGHT 158 lb 158 lb 159 lb 0.6 oz  HEIGHT 5\' 3"  5\' 3"  5' 2.5"  BMI 28 kg/m2 28 kg/m2 28.61 kg/m2    Current exercise per week 90 minutes.

## 2015-06-24 NOTE — Assessment & Plan Note (Signed)
Uncontrolled.Toradol and depo medrol administered IM in the office , to be followed by a short course of oral prednisone and NSAIDS.  

## 2015-06-24 NOTE — Assessment & Plan Note (Signed)
Sleep hygiene reviewed still uses beer to "relax" her, advised to cut back and plan to stop. current is approx 42 oz  daily

## 2015-06-24 NOTE — Progress Notes (Signed)
Subjective:    Patient ID: Kathy Stewart, female    DOB: [birth year], 45 y.o.   MRN: [id number]  HPI   Kathy Stewart     MRN: [id number]      DOB: Aug 26, 1970   HPI [name] is here for follow up and re-evaluation of chronic medical conditions, medication management and review of any available recent lab and radiology data.  Preventive health is updated, specifically  Cancer screening and Immunization.   . The PT denies any adverse reactions to current medications since the last visit. Not taking blood pressure med consistently,  Re educated about this  Still smoking , unwilling to set quit date, down to on avg 42 oz beer at night, wants to cut back more. New left arm pain radiaiting from shoulder and neck, disturbs sleep, has established c spine disease, denies neck pain  ROS Denies recent fever or chills. Denies sinus pressure, nasal congestion, ear pain or sore throat. Denies chest congestion, productive cough or wheezing. Denies chest pains, palpitations and leg swelling Denies abdominal pain, nausea, vomiting,diarrhea or constipation.   Denies dysuria, frequency, hesitancy or incontinence.  Denies headaches, seizures, . Denies depression, anxiety  Denies skin break down or rash.   PE  BP 150/88 mmHg  Pulse 62  Resp 16  Ht 5\' 3"  (1.6 m)  Wt 158 lb (71.668 kg)  BMI 28.00 kg/m2  SpO2 100%  LMP 10/17/2012  Patient alert and oriented and in no cardiopulmonary distress.  HEENT: No facial asymmetry, EOMI,   oropharynx pink and moist.  Neck supple no JVD, no mass.  Chest: Clear to auscultation bilaterally.  CVS: S1, S2 no murmurs, no S3.Regular rate.  ABD: Soft non tender.   Ext: No edema  MS: Adequate ROM spine, shoulders, hips and knees.  Skin: Intact, no ulcerations or rash noted.  Psych: Good eye contact, normal affect. Memory intact not anxious or depressed appearing.  CNS: CN 2-12 intact, power,  normal throughout.no focal deficits  noted.   Assessment & Plan   Essential hypertension Uncontrolled, non compliant, recheck  By nurse in 6 weeks  DASH diet and commitment to daily physical activity for a minimum of 30 minutes discussed and encouraged, as a part of hypertension management. The importance of attaining a healthy weight is also discussed.  BP/Weight 06/24/2015 01/14/2015 08/05/2014 06/11/2014 04/15/2014 03/25/2014 0000000  Systolic BP Q000111Q 123XX123 Q000111Q - - Q000111Q [id number]  Diastolic BP 88 82 80 - - 84 84  Wt. (Lbs) 158 158 159.04 159 50 150 139  BMI 28 28 28.61 29.07 9.14 26.58 25        NICOTINE ADDICTION Patient counseled for approximately 5 minutes regarding the health risks of ongoing nicotine use, specifically all types of cancer, heart disease, stroke and respiratory failure. The options available for help with cessation ,the behavioral changes to assist the process, and the option to either gradully reduce usage  Or abruptly stop.is also discussed. Pt is also encouraged to set specific goals in number of cigarettes used daily, as well as to set a quit date.  Number of cigarettes/cigars currently smoking daily: 7   Cervical neck pain with evidence of disc disease Uncontrolled.Toradol and depo medrol administered IM in the office , to be followed by a short course of oral prednisone and NSAIDS.   INSOMNIA Sleep hygiene reviewed still uses beer to "relax" her, advised to cut back and plan to stop. current is approx 42 oz  daily  Overweight (BMI 25.0-29.9)  Unchanged Patient re-educated about  the importance of commitment to a  minimum of 150 minutes of exercise per week.  The importance of healthy food choices with portion control discussed. Encouraged to start a food diary, count calories and to consider  joining a support group. Sample diet sheets offered. Goals set by the patient for the next several months.   Weight /BMI 06/24/2015 01/14/2015 08/05/2014  WEIGHT 158 lb 158 lb 159 lb 0.6 oz  HEIGHT 5\' 3"   5\' 3"  5' 2.5"  BMI 28 kg/m2 28 kg/m2 28.61 kg/m2    Current exercise per week 90 minutes.   Prediabetes Patient educated about the importance of limiting  Carbohydrate intake , the need to commit to daily physical activity for a minimum of 30 minutes , and to commit weight loss. The fact that changes in all these areas will reduce or eliminate all together the development of diabetes is stressed.   Diabetic Labs Latest Ref Rng 12/02/2014 03/25/2014 12/17/2012 12/13/2012 10/16/2010  HbA1c <5.7 % 5.8(H) - - - -  Chol 125 - 200 mg/dL 184 - - - -  HDL >=46 mg/dL 50 - - - -  Calc LDL <130 mg/dL 104 - - - -  Triglycerides <150 mg/dL 152(H) - - - -  Creatinine 0.50 - 1.10 mg/dL 0.61 0.75 0.64 0.69 0.64   BP/Weight 06/24/2015 01/14/2015 08/05/2014 06/11/2014 04/15/2014 03/25/2014 0000000  Systolic BP Q000111Q 123XX123 Q000111Q - - Q000111Q [id number]  Diastolic BP 88 82 80 - - 84 84  Wt. (Lbs) 158 158 159.04 159 50 150 139  BMI 28 28 28.61 29.07 9.14 26.58 25   No flowsheet data found.   Updated lab needed at/ before next visit.       Review of Systems     Objective:   Physical Exam        Assessment & Plan:

## 2015-06-24 NOTE — Assessment & Plan Note (Signed)
Patient educated about the importance of limiting  Carbohydrate intake , the need to commit to daily physical activity for a minimum of 30 minutes , and to commit weight loss. The fact that changes in all these areas will reduce or eliminate all together the development of diabetes is stressed.   Diabetic Labs Latest Ref Rng 12/02/2014 03/25/2014 12/17/2012 12/13/2012 10/16/2010  HbA1c <5.7 % 5.8(H) - - - -  Chol 125 - 200 mg/dL 184 - - - -  HDL >=46 mg/dL 50 - - - -  Calc LDL <130 mg/dL 104 - - - -  Triglycerides <150 mg/dL 152(H) - - - -  Creatinine 0.50 - 1.10 mg/dL 0.61 0.75 0.64 0.69 0.64   BP/Weight 06/24/2015 01/14/2015 08/05/2014 06/11/2014 04/15/2014 03/25/2014 0000000  Systolic BP Q000111Q 123XX123 Q000111Q - - Q000111Q [id number]  Diastolic BP 88 82 80 - - 84 84  Wt. (Lbs) 158 158 159.04 159 50 150 139  BMI 28 28 28.61 29.07 9.14 26.58 25   No flowsheet data found.   Updated lab needed at/ before next visit.

## 2015-06-24 NOTE — Assessment & Plan Note (Signed)

## 2015-06-24 NOTE — Patient Instructions (Addendum)
Nurse BP check in 6 weeks  Labs in 6 weeks   MD follow up in 4 month  Please work on lifestyle changes discussed to improve health  Pain in left arm is from disc disease in neck, and arthritis Injections (2 ) in office today  1 week course of prednisone Voltaren gel to be used topically 3 to 4 times daily Gabapentin 1 at bedtime will help pain and sleep, this is not addictive  Thanks for choosing [facility], we consider it a privelige to serve you.

## 2015-07-07 IMAGING — MG MM DIGITAL DIAGNOSTIC BILAT W/ CAD
4 series · 4 of 4 positions shown · non-contrast
Comparison: 02/17/2014 and earlier

CLINICAL DATA: One year followup for left breast nodule.

EXAM:
DIGITAL DIAGNOSTIC BILATERAL MAMMOGRAM WITH CAD
ULTRASOUND LEFT BREAST

[R CC]
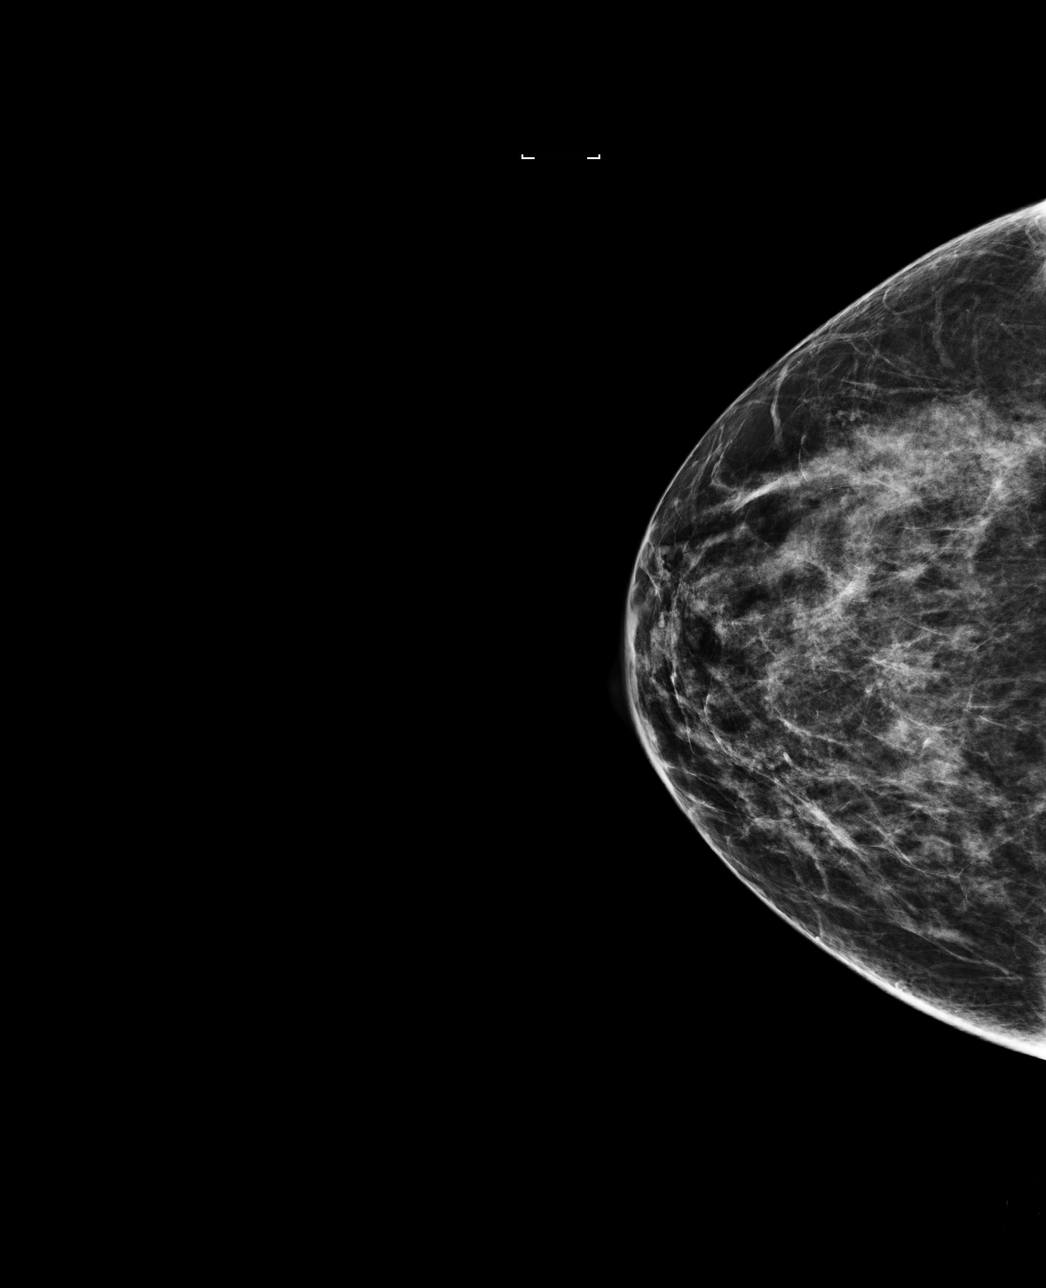

[L MLO]
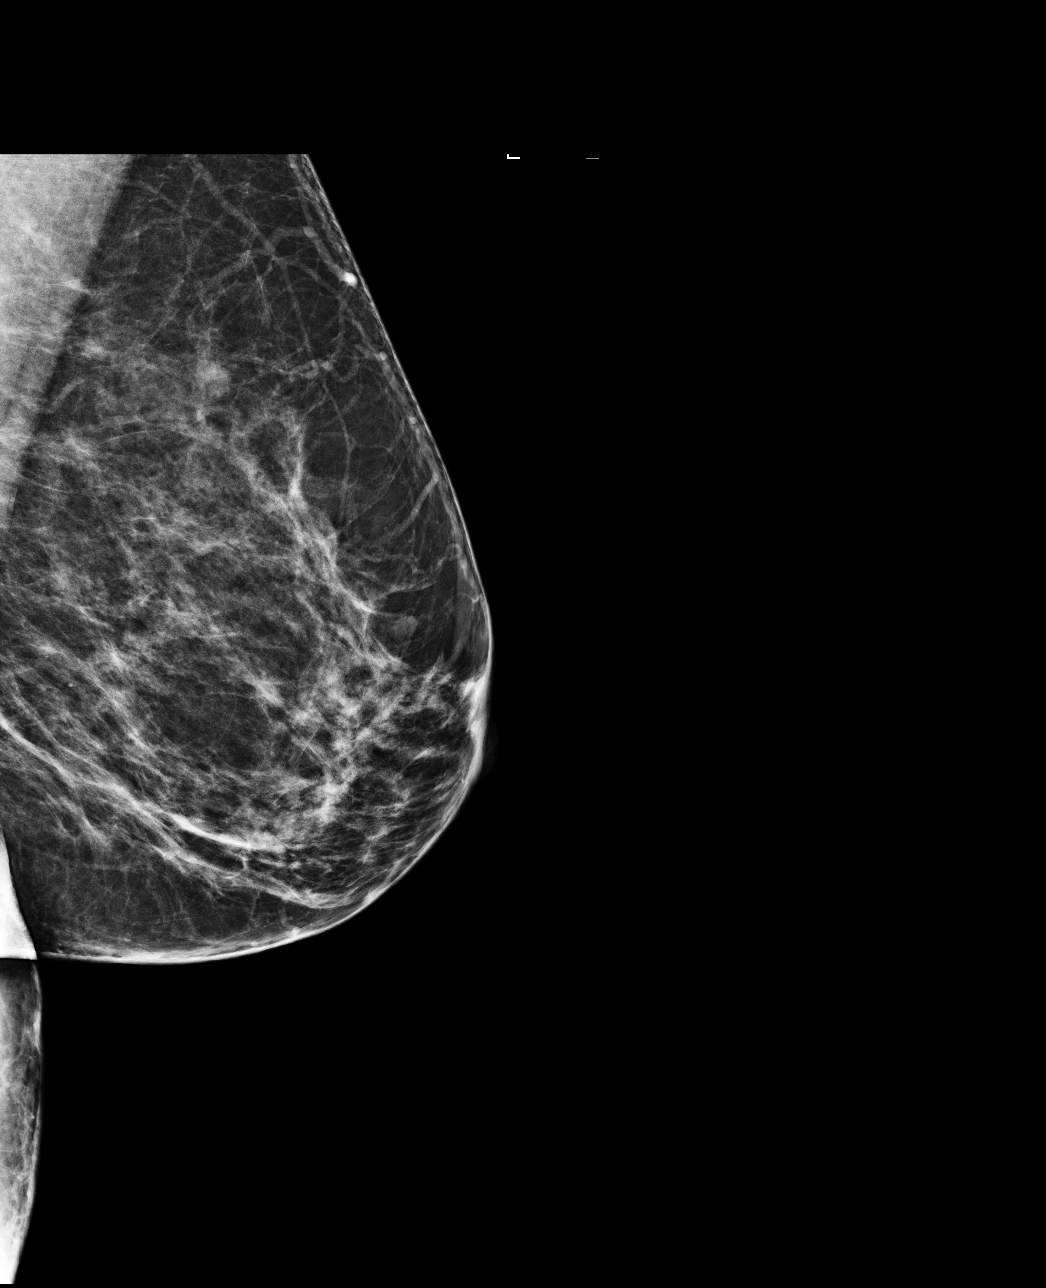

[R MLO]
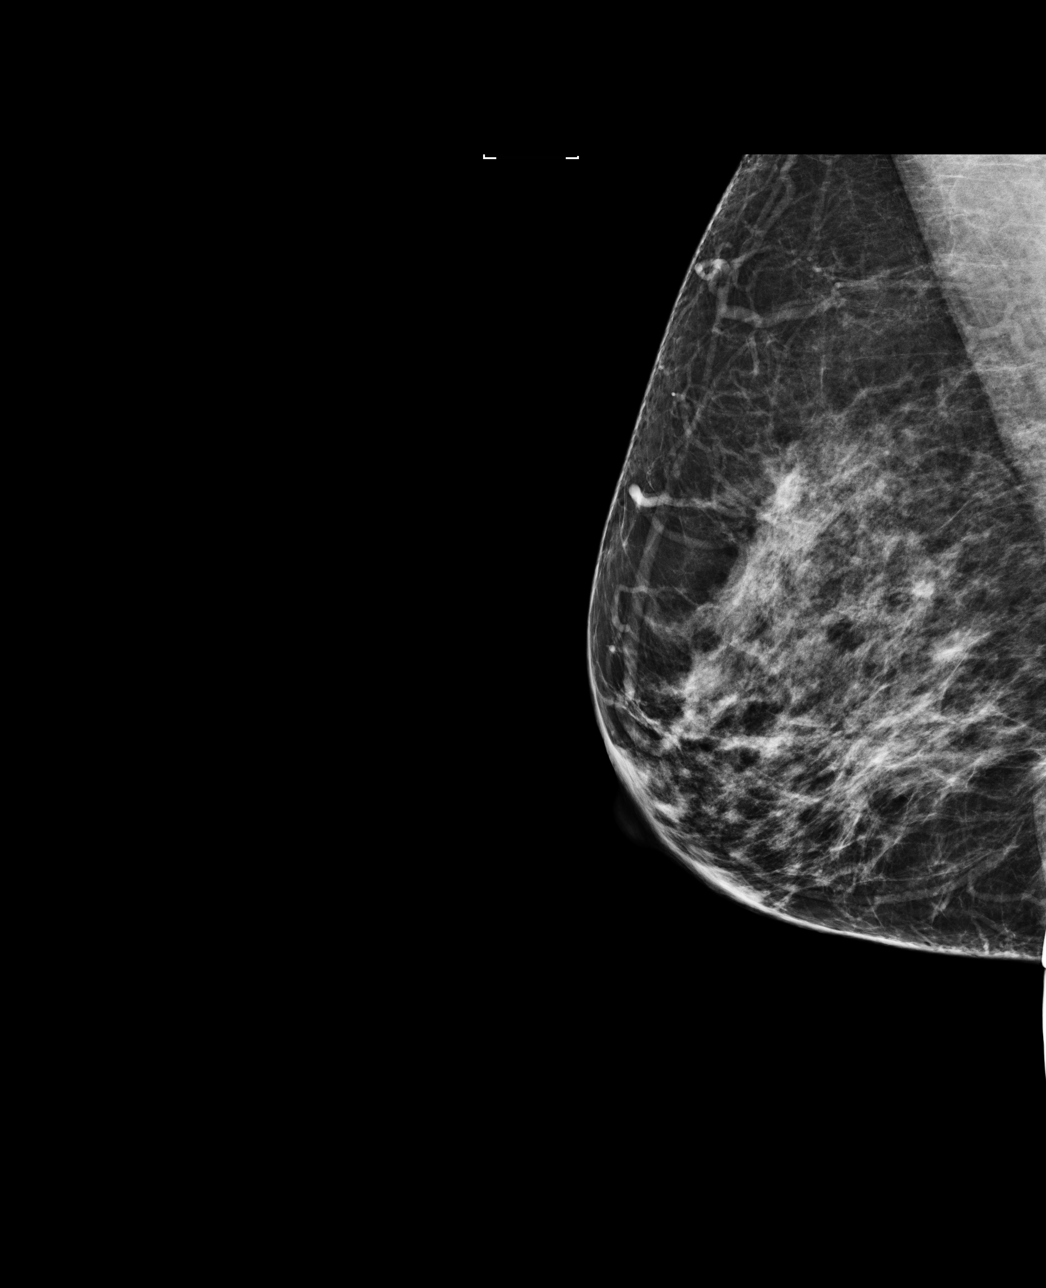

[L CC]
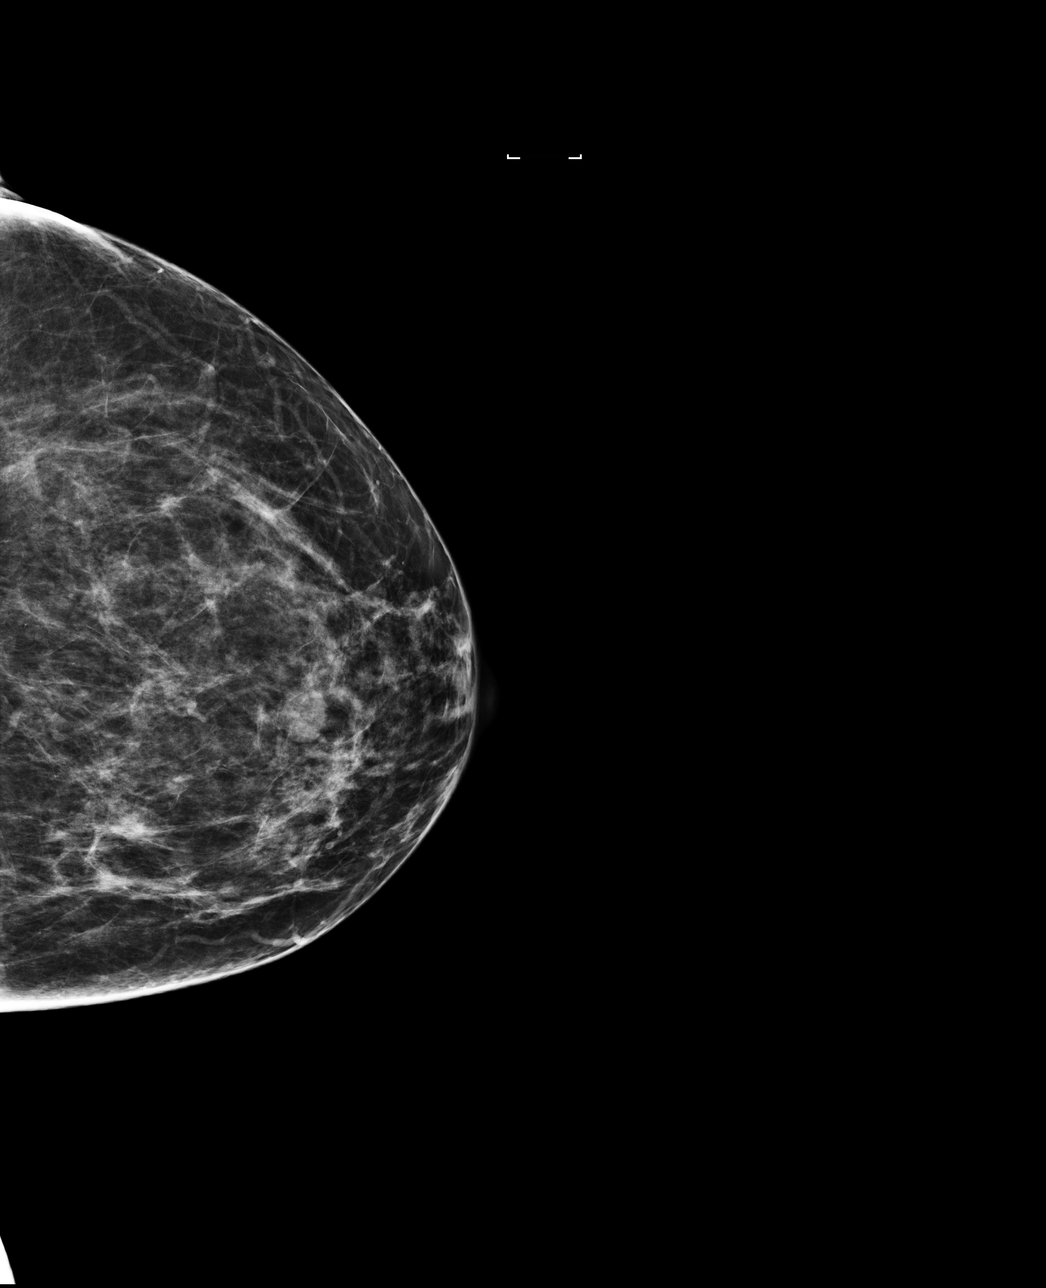

[4 of 4 positions shown; findings below may reference images not displayed]

ACR Breast Density Category c: The breast tissue is heterogeneously
dense, which may obscure small masses.
FINDINGS: A circumscribed nodule is again noted in the upper central portion
of the left breast, partially obscured but stable in appearance. No
new mass, distortion, or suspicious microcalcifications identified
in either breast.

Mammographic images were processed with CAD.

On physical exam, I palpate no abnormality in the upper central
aspect of the left breast.

Targeted ultrasound is performed, showing a circumscribed hypoechoic
bilobed nodule in the 12 o'clock location of the left breast 3 cm
from nipple. Mass measures 1.0 x 0.4 x 0.8 cm. There is associated
increased through transmission. Appearance is stable compared with
prior study.
IMPRESSION: 1. Stable appearance of probably benign nodule in the 12 o'clock
location of the left breast.
2. Continued followup is recommended to document 2 years of
stability.
3.  No mammographic or ultrasound evidence for malignancy.

RECOMMENDATION:
Bilateral diagnostic mammogram and left breast ultrasound suggested
in 1 year.

I have discussed the findings and recommendations with the patient.
Results were also provided in writing at the conclusion of the
visit. If applicable, a reminder letter will be sent to the patient
regarding the next appointment.

BI-RADS CATEGORY  3: Probably benign.

## 2015-07-29 ENCOUNTER — Telehealth: Payer: Self-pay | Admitting: Family Medicine

## 2015-07-29 MED ORDER — IBUPROFEN 800 MG PO TABS: 800.0000 mg | ORAL_TABLET | Freq: Three times a day (TID) | ORAL | Status: DC | PRN

## 2015-07-29 NOTE — Telephone Encounter (Signed)
Ok to prescribe for patient to take prn or recommend tylenol?

## 2015-07-29 NOTE — Telephone Encounter (Signed)
Sent to her pharmacy. 

## 2015-07-29 NOTE — Telephone Encounter (Signed)
Called and left voicemail notifying patient of rx

## 2015-07-29 NOTE — Telephone Encounter (Signed)
Patient is asking for Ibuprofren 800 mg for her arthritis pain, please advise?

## 2015-08-05 ENCOUNTER — Telehealth: Payer: Self-pay

## 2015-08-05 NOTE — Telephone Encounter (Signed)
States she is unable to get here for a BP check and the nurse at her work checked it and it was 120/73 with a pulse of 73.

## 2015-08-15 ENCOUNTER — Other Ambulatory Visit: Payer: Self-pay | Admitting: Family Medicine

## 2015-08-16 ENCOUNTER — Other Ambulatory Visit: Payer: Self-pay | Admitting: Family Medicine

## 2015-08-16 DIAGNOSIS — I10 Essential (primary) hypertension: Secondary | ICD-10-CM

## 2015-08-16 MED ORDER — ATENOLOL 25 MG PO TABS: 25.0000 mg | ORAL_TABLET | Freq: Every day | ORAL | Status: DC

## 2015-08-16 NOTE — Telephone Encounter (Signed)
Med sent.

## 2015-08-16 NOTE — Telephone Encounter (Signed)
Patient is calling requesting a refill on atenolol (TENORMIN) 25 MG tablet , please advise?

## 2015-10-19 ENCOUNTER — Telehealth: Payer: Self-pay | Admitting: Family Medicine

## 2015-10-19 DIAGNOSIS — I10 Essential (primary) hypertension: Secondary | ICD-10-CM

## 2015-10-19 DIAGNOSIS — R7303 Prediabetes: Secondary | ICD-10-CM

## 2015-10-19 NOTE — Telephone Encounter (Signed)
Kathy Stewart is asking if her lab work is up to date in the computer she is scheduled to see Dr. Moshe Cipro on 11/26/15 and will do her fasting labs before, she said to make sure that they check her Anemia as well

## 2015-10-19 NOTE — Telephone Encounter (Signed)
Lab order updated.

## 2015-10-20 ENCOUNTER — Ambulatory Visit: Payer: BC Managed Care – PPO | Admitting: Family Medicine

## 2015-11-18 ENCOUNTER — Other Ambulatory Visit: Payer: Self-pay

## 2015-11-18 ENCOUNTER — Other Ambulatory Visit: Payer: Self-pay | Admitting: Family Medicine

## 2015-11-18 DIAGNOSIS — R928 Other abnormal and inconclusive findings on diagnostic imaging of breast: Secondary | ICD-10-CM

## 2015-11-18 DIAGNOSIS — N63 Unspecified lump in unspecified breast: Secondary | ICD-10-CM

## 2015-11-18 DIAGNOSIS — Z09 Encounter for follow-up examination after completed treatment for conditions other than malignant neoplasm: Secondary | ICD-10-CM

## 2015-11-25 ENCOUNTER — Telehealth: Payer: Self-pay | Admitting: Family Medicine

## 2015-11-25 DIAGNOSIS — D539 Nutritional anemia, unspecified: Secondary | ICD-10-CM

## 2015-11-25 NOTE — Telephone Encounter (Signed)
Going to get labs done Saturday - wants to add her iron lab on to that labs if that's ok

## 2015-11-26 ENCOUNTER — Ambulatory Visit: Payer: BC Managed Care – PPO | Admitting: Family Medicine

## 2015-11-26 DIAGNOSIS — D539 Nutritional anemia, unspecified: Secondary | ICD-10-CM | POA: Insufficient documentation

## 2015-11-26 NOTE — Telephone Encounter (Signed)
Lab added on

## 2015-11-26 NOTE — Telephone Encounter (Signed)
Faxed over lab order. Tried to call but no answer and no option to leave message

## 2015-11-27 LAB — BASIC METABOLIC PANEL
BUN: 11 mg/dL (ref 7–25)
CO2: 24 mmol/L (ref 20–31)
Calcium: 9.6 mg/dL (ref 8.6–10.2)
Chloride: 104 mmol/L (ref 98–110)
Creat: 0.71 mg/dL (ref 0.50–1.10)
Glucose, Bld: 92 mg/dL (ref 65–99)
Potassium: 4.1 mmol/L (ref 3.5–5.3)
SODIUM: 138 mmol/L (ref 135–146)

## 2015-11-27 LAB — LIPID PANEL
CHOL/HDL RATIO: 3.5 ratio (ref <=5.0)
Cholesterol: 204 mg/dL — ABNORMAL HIGH (ref 125–200)
HDL: 59 mg/dL (ref >=46)
LDL Cholesterol: 123 mg/dL (ref <130)
Triglycerides: 112 mg/dL (ref <150)
VLDL: 22 mg/dL (ref <30)

## 2015-11-27 LAB — CBC
HCT: 39.6 % (ref 35.0–45.0)
HEMOGLOBIN: 13.3 g/dL (ref 11.7–15.5)
MCH: 29.2 pg (ref 27.0–33.0)
MCHC: 33.6 g/dL (ref 32.0–36.0)
MCV: 87 fL (ref 80.0–100.0)
MPV: 11.5 fL (ref 7.5–12.5)
Platelets: 164 10*3/uL (ref 140–400)
RBC: 4.55 MIL/uL (ref 3.80–5.10)
RDW: 12.8 % (ref 11.0–15.0)
WBC: 7.5 10*3/uL (ref 3.8–10.8)

## 2015-11-27 LAB — IRON: Iron: 98 ug/dL (ref 40–190)

## 2015-11-27 LAB — TSH: TSH: 0.72 mIU/L

## 2015-11-28 LAB — HEMOGLOBIN A1C
Hgb A1c MFr Bld: 5.6 % (ref <5.7)
Mean Plasma Glucose: 114 mg/dL

## 2015-12-03 ENCOUNTER — Encounter: Payer: Self-pay | Admitting: Family Medicine

## 2015-12-03 ENCOUNTER — Ambulatory Visit (INDEPENDENT_AMBULATORY_CARE_PROVIDER_SITE_OTHER): Payer: PRIVATE HEALTH INSURANCE | Admitting: Family Medicine

## 2015-12-03 VITALS — BP 160/90 | HR 62 | Resp 16 | Ht 63.0 in | Wt 155.0 lb

## 2015-12-03 DIAGNOSIS — I10 Essential (primary) hypertension: Secondary | ICD-10-CM | POA: Diagnosis not present

## 2015-12-03 DIAGNOSIS — R928 Other abnormal and inconclusive findings on diagnostic imaging of breast: Secondary | ICD-10-CM

## 2015-12-03 DIAGNOSIS — F17200 Nicotine dependence, unspecified, uncomplicated: Secondary | ICD-10-CM | POA: Diagnosis not present

## 2015-12-03 DIAGNOSIS — Z1159 Encounter for screening for other viral diseases: Secondary | ICD-10-CM

## 2015-12-03 DIAGNOSIS — E785 Hyperlipidemia, unspecified: Secondary | ICD-10-CM | POA: Diagnosis not present

## 2015-12-03 DIAGNOSIS — J302 Other seasonal allergic rhinitis: Secondary | ICD-10-CM

## 2015-12-03 DIAGNOSIS — E663 Overweight: Secondary | ICD-10-CM

## 2015-12-03 DIAGNOSIS — R7303 Prediabetes: Secondary | ICD-10-CM

## 2015-12-03 MED ORDER — ATENOLOL 50 MG PO TABS: 50.0000 mg | ORAL_TABLET | Freq: Every day | ORAL | 3 refills | Status: DC

## 2015-12-03 NOTE — Assessment & Plan Note (Addendum)
Uncontrolled, increase dose of tenormin, nurse BP check in 5 weeks  DASH diet and commitment to daily physical activity for a minimum of 30 minutes discussed and encouraged, as a part of hypertension management. The importance of attaining a healthy weight is also discussed.  BP/Weight 12/03/2015 06/24/2015 01/14/2015 08/05/2014 06/11/2014 04/15/2014 [id number]  Systolic BP 0000000 Q000111Q 123XX123 Q000111Q - - Q000111Q  Diastolic BP 90 88 82 80 - - 84  Wt. (Lbs) 155 158 158 159.04 159 50 150  BMI 27.46 28 28 28.61 29.07 9.14 26.58

## 2015-12-03 NOTE — Patient Instructions (Addendum)
F/u in 4 month, call if you need me before'   Nurse bP check in 5 weeks  Reduce fried and fatty foods, and walk for health for 30 minutes every day INCREASE blood pressure med to tWO 25 mg tabs every morning together till done, then new pill sent is ONE 50 mg tablet ONCE daily  CONGRATS on reducing cigarettes to 4/ day and beer, continue to work on quitting both  [facility] is that sleep will improve  Breast imaging is being ordered , please keep appt  Thank you  for choosing [facility]. We consider it a privelige to serve you.  Delivering excellent health care in a caring and  compassionate way is our goal.  Partnering with you,  so that together we can achieve this goal is our strategy.

## 2015-12-05 DIAGNOSIS — E785 Hyperlipidemia, unspecified: Secondary | ICD-10-CM | POA: Insufficient documentation

## 2015-12-05 NOTE — Assessment & Plan Note (Signed)
Currently corrected, she is applauded on this

## 2015-12-05 NOTE — Assessment & Plan Note (Signed)
Uncontrolled, needs to commit to daily medication and saline nasal flushes

## 2015-12-05 NOTE — Assessment & Plan Note (Signed)
Patient counseled for approximately 5 minutes regarding the health risks of ongoing nicotine use, specifically all types of cancer, heart disease, stroke and respiratory failure. The options available for help with cessation ,the behavioral changes to assist the process, and the option to either gradully reduce usage  Or abruptly stop.is also discussed. Pt is also encouraged to set specific goals in number of cigarettes used daily, as well as to set a quit date. Down to 5 per day , workin on quitting

## 2015-12-05 NOTE — Assessment & Plan Note (Signed)
Hyperlipidemia:Low fat diet discussed and encouraged.   Lipid Panel  Lab Results  Component Value Date   CHOL 204 (H) 11/27/2015   HDL 59 11/27/2015   LDLCALC 123 11/27/2015   TRIG 112 11/27/2015   CHOLHDL 3.5 11/27/2015  not at goal currently needs to work on this

## 2015-12-05 NOTE — Progress Notes (Signed)
Kathy Stewart     MRN: [id number]      DOB: 08/06/1970   HPI Kathy Stewart is here for follow up and re-evaluation of chronic medical conditions, medication management and review of any available recent lab and radiology data.  Preventive health is updated, specifically  Cancer screening and Immunization.   Needs rept imaging of breasts and is scheduled for this next week Has cut back a lot on both beer and cigarettes, wants to quit nicotine, stressed at home due to presence of a lot of younger family members living with her currently with their problems    ROS Denies recent fever or chills. Denies sinus pressure, nasal congestion, ear pain or sore throat. Denies chest congestion, productive cough or wheezing. Denies chest pains, palpitations and leg swelling Denies abdominal pain, nausea, vomiting,diarrhea or constipation.   Denies dysuria, frequency, hesitancy or incontinence. Denies joint pain, swelling and limitation in mobility. Denies headaches, seizures, numbness, or tingling. Denies depression, c/o increased  anxiety and  insomnia. Denies skin break down or rash.   PE  BP (!) 160/90   Pulse 62   Resp 16   Ht 5\' 3"  (1.6 m)   Wt 155 lb (70.3 kg)   LMP 10/17/2012   SpO2 100%   BMI 27.46 kg/m   Patient alert and oriented and in no cardiopulmonary distress.  HEENT: No facial asymmetry, EOMI,   oropharynx pink and moist.  Neck supple no JVD, no mass.  Chest: Clear to auscultation bilaterally.  CVS: S1, S2 no murmurs, no S3.Regular rate.  ABD: Soft non tender.   Ext: No edema  MS: Adequate ROM spine, shoulders, hips and knees.  Skin: Intact, no ulcerations or rash noted.  Psych: Good eye contact, normal affect. Memory intact not anxious or depressed appearing.  CNS: CN 2-12 intact, power,  normal throughout.no focal deficits noted.   Assessment & Plan Essential hypertension Uncontrolled, increase dose of tenormin, nurse BP check in 5 weeks  DASH diet  and commitment to daily physical activity for a minimum of 30 minutes discussed and encouraged, as a part of hypertension management. The importance of attaining a healthy weight is also discussed.  BP/Weight 12/03/2015 06/24/2015 01/14/2015 08/05/2014 06/11/2014 04/15/2014 [id number]  Systolic BP 0000000 Q000111Q 123XX123 Q000111Q - - Q000111Q  Diastolic BP 90 88 82 80 - - 84  Wt. (Lbs) 155 158 158 159.04 159 50 150  BMI 27.46 28 28 28.61 29.07 9.14 26.58       Overweight (BMI 25.0-29.9) Improved. Pt applauded on succesful weight loss through lifestyle change, and encouraged to continue same. Weight loss goal set for the next several months.   NICOTINE ADDICTION Patient counseled for approximately 5 minutes regarding the health risks of ongoing nicotine use, specifically all types of cancer, heart disease, stroke and respiratory failure. The options available for help with cessation ,the behavioral changes to assist the process, and the option to either gradully reduce usage  Or abruptly stop.is also discussed. Pt is also encouraged to set specific goals in number of cigarettes used daily, as well as to set a quit date. Down to 5 per day , workin on quitting    Seasonal allergies Uncontrolled, needs to commit to daily medication and saline nasal flushes  Prediabetes Currently corrected, she is applauded on this  Hyperlipidemia LDL goal <130 Hyperlipidemia:Low fat diet discussed and encouraged.   Lipid Panel  Lab Results  Component Value Date   CHOL 204 (H) 11/27/2015   HDL 59  11/27/2015   [place] 123 11/27/2015   TRIG 112 11/27/2015   CHOLHDL 3.5 11/27/2015  not at goal currently needs to work on this

## 2015-12-05 NOTE — Assessment & Plan Note (Signed)
Improved. Pt applauded on succesful weight loss through lifestyle change, and encouraged to continue same. Weight loss goal set for the next several months.  

## 2015-12-07 ENCOUNTER — Ambulatory Visit (HOSPITAL_COMMUNITY)
Admission: RE | Admit: 2015-12-07 | Discharge: 2015-12-07 | Disposition: A | Discharge status: Home / Self Care | Payer: 59 | Source: Ambulatory Visit | Attending: Family Medicine | Admitting: Family Medicine

## 2015-12-07 DIAGNOSIS — N63 Unspecified lump in unspecified breast: Secondary | ICD-10-CM

## 2015-12-07 DIAGNOSIS — Z09 Encounter for follow-up examination after completed treatment for conditions other than malignant neoplasm: Secondary | ICD-10-CM | POA: Diagnosis not present

## 2015-12-07 NOTE — Addendum Note (Signed)
Addended by: Eual Fines on: 12/07/2015 09:24 AM   Modules accepted: Orders

## 2016-01-25 ENCOUNTER — Telehealth: Payer: Self-pay | Admitting: Family Medicine

## 2016-01-25 NOTE — Telephone Encounter (Signed)
Kathy Stewart is stating that she is sitting up at work and is still falling asleep, and at stop lights, happening about 3 times a week since shes talked to Dr. Moshe Cipro, please advise?

## 2016-01-25 NOTE — Telephone Encounter (Signed)
Called and left message to call back and we would schedule her to be seen upon dr simpson's return

## 2016-03-01 ENCOUNTER — Ambulatory Visit: Payer: PRIVATE HEALTH INSURANCE | Admitting: Family Medicine

## 2016-03-16 ENCOUNTER — Encounter: Payer: Self-pay | Admitting: Family Medicine

## 2016-03-16 ENCOUNTER — Ambulatory Visit (INDEPENDENT_AMBULATORY_CARE_PROVIDER_SITE_OTHER): Payer: PRIVATE HEALTH INSURANCE | Admitting: Family Medicine

## 2016-03-16 VITALS — BP 158/88 | HR 73 | Temp 99.2°F | Resp 16 | Ht 63.0 in

## 2016-03-16 DIAGNOSIS — B354 Tinea corporis: Secondary | ICD-10-CM

## 2016-03-16 DIAGNOSIS — F17200 Nicotine dependence, unspecified, uncomplicated: Secondary | ICD-10-CM

## 2016-03-16 DIAGNOSIS — I10 Essential (primary) hypertension: Secondary | ICD-10-CM

## 2016-03-16 DIAGNOSIS — J0100 Acute maxillary sinusitis, unspecified: Secondary | ICD-10-CM

## 2016-03-16 DIAGNOSIS — J209 Acute bronchitis, unspecified: Secondary | ICD-10-CM | POA: Diagnosis not present

## 2016-03-16 MED ORDER — PREDNISONE 5 MG PO TABS: 5.0000 mg | ORAL_TABLET | Freq: Two times a day (BID) | ORAL | 0 refills | Status: AC

## 2016-03-16 MED ORDER — PROMETHAZINE-DM 6.25-15 MG/5ML PO SYRP: ORAL_SOLUTION | ORAL | 0 refills | Status: DC

## 2016-03-16 MED ORDER — SULFAMETHOXAZOLE-TRIMETHOPRIM 800-160 MG PO TABS: 1.0000 | ORAL_TABLET | Freq: Two times a day (BID) | ORAL | 0 refills | Status: DC

## 2016-03-16 MED ORDER — TERBINAFINE HCL 250 MG PO TABS: 250.0000 mg | ORAL_TABLET | Freq: Every day | ORAL | 0 refills | Status: DC

## 2016-03-16 MED ORDER — BENZONATATE 100 MG PO CAPS: 100.0000 mg | ORAL_CAPSULE | Freq: Two times a day (BID) | ORAL | 0 refills | Status: DC | PRN

## 2016-03-16 MED ORDER — CLOTRIMAZOLE-BETAMETHASONE 1-0.05 % EX CREA: TOPICAL_CREAM | CUTANEOUS | 1 refills | Status: DC

## 2016-03-16 NOTE — Patient Instructions (Addendum)
F/u in December as before, call if you need me before  Please STOP smoking, this will improve your health   You are treated for acute sinusitis and bronchitis, antibiotics , , decongestant, cough suppressant and short course of prednisone sent .   2 weeks of tablets for rash, to sart AFTER you finish antibiotic treatment Use antifungal cream twice daily to rash Thanks for choosing [facility], we consider it a privelige to serve you.

## 2016-03-20 ENCOUNTER — Encounter: Payer: Self-pay | Admitting: Family Medicine

## 2016-03-20 DIAGNOSIS — J019 Acute sinusitis, unspecified: Secondary | ICD-10-CM | POA: Insufficient documentation

## 2016-03-20 DIAGNOSIS — B354 Tinea corporis: Secondary | ICD-10-CM | POA: Insufficient documentation

## 2016-03-20 NOTE — Assessment & Plan Note (Signed)

## 2016-03-20 NOTE — Assessment & Plan Note (Signed)
Uncontrolled at visit, no med change DASH diet and commitment to daily physical activity for a minimum of 30 minutes discussed and encouraged, as a part of hypertension management. The importance of attaining a healthy weight is also discussed.  BP/Weight 03/16/2016 12/03/2015 06/24/2015 01/14/2015 08/05/2014 06/11/2014 [id number]  Systolic BP 0000000 0000000 Q000111Q 123XX123 Q000111Q - -  Diastolic BP 88 90 88 82 80 - -  Wt. (Lbs) - 155 158 158 159.04 159 50  BMI - 27.46 28 28 28.61 29.07 9.14

## 2016-03-20 NOTE — Assessment & Plan Note (Signed)
antibiotic prescribed 

## 2016-03-20 NOTE — Progress Notes (Signed)
   Kathy Stewart     MRN: [id number]      DOB: 1970/07/20   HPI Kathy Stewart  1 week h/o worsening head and chest congestion, associated with fever and chills intermittently. Nasal drainage has thickened , and is yellowish green, and at times bloody. Sputum is thick and yellow. C/o bilateral ear pressure, denies hearing loss and sore throat. Increasing fatigue , poor appetitie and sleep disturbed by cough. No improvement with OTC medication. C/o itchy rash in patches over trunk and extremities in past 3 weeks, no drainage or pus, no new products  ROS  Denies chest pains, palpitations and leg swelling Denies abdominal pain, nausea, vomiting,diarrhea or constipation.   Denies dysuria, frequency, hesitancy or incontinence. Denies joint pain, swelling and limitation in mobility. Denies headaches, seizures, numbness, or tingling. Denies depression, anxiety or insomnia.    PE  BP (!) 158/88   Pulse 73   Temp 99.2 F (37.3 C)   Resp 16   Ht 5\' 3"  (1.6 m)   LMP 10/17/2012   SpO2 98%   Patient alert and oriented and in no cardiopulmonary distress.  HEENT: No facial asymmetry, EOMI,   oropharynx pink and moist.  Neck supple no JVD, no mass.Frontal and maxillary sinus tenderness, TM clear  Chest: adequate air entry, scattered crackles no whezes  CVS: S1, S2 no murmurs, no S3.Regular rate.  ABD: Soft non tender.   Ext: No edema  MS: Adequate ROM spine, shoulders, hips and knees.  Skin: circular macular rashes on trunk and extremities, tinea corporis  Psych: Good eye contact, normal affect. Memory intact not anxious or depressed appearing.  CNS: CN 2-12 intact, power,  normal throughout.no focal deficits noted.   Assessment & Plan  Acute bronchitis Decongestant , antibiotic prescribed  NICOTINE ADDICTION Patient counseled for approximately 5 minutes regarding the health risks of ongoing nicotine use, specifically all types of cancer, heart disease, stroke and  respiratory failure. The options available for help with cessation ,the behavioral changes to assist the process, and the option to either gradully reduce usage  Or abruptly stop.is also discussed. Pt is also encouraged to set specific goals in number of cigarettes used daily, as well as to set a quit date.     Acute sinusitis antibiotic prescribed  Essential hypertension Uncontrolled at visit, no med change DASH diet and commitment to daily physical activity for a minimum of 30 minutes discussed and encouraged, as a part of hypertension management. The importance of attaining a healthy weight is also discussed.  BP/Weight 03/16/2016 12/03/2015 06/24/2015 01/14/2015 08/05/2014 06/11/2014 [id number]  Systolic BP 0000000 0000000 Q000111Q 123XX123 Q000111Q - -  Diastolic BP 88 90 88 82 80 - -  Wt. (Lbs) - 155 158 158 159.04 159 50  BMI - 27.46 28 28 28.61 29.07 9.14       Tinea corporis Oral antifungal x 2 weeks and topical cream

## 2016-03-20 NOTE — Assessment & Plan Note (Signed)
Decongestant , antibiotic prescribed

## 2016-03-20 NOTE — Assessment & Plan Note (Signed)
Oral antifungal x 2 weeks and topical cream

## 2016-03-24 ENCOUNTER — Other Ambulatory Visit: Payer: Self-pay | Admitting: Family Medicine

## 2016-04-02 ENCOUNTER — Other Ambulatory Visit: Payer: Self-pay | Admitting: Family Medicine

## 2016-04-10 ENCOUNTER — Encounter: Payer: Self-pay | Admitting: Family Medicine

## 2016-04-10 ENCOUNTER — Ambulatory Visit: Payer: PRIVATE HEALTH INSURANCE | Admitting: Family Medicine

## 2016-04-11 ENCOUNTER — Telehealth: Payer: Self-pay | Admitting: Family Medicine

## 2016-04-11 NOTE — Telephone Encounter (Signed)
Med refilled.

## 2016-04-11 NOTE — Telephone Encounter (Signed)
Kathy Stewart is calling requesting a refill on ibuprofen (ADVIL,MOTRIN) 800 MG tablet please advise?

## 2016-08-22 ENCOUNTER — Ambulatory Visit (INDEPENDENT_AMBULATORY_CARE_PROVIDER_SITE_OTHER): Payer: BLUE CROSS/BLUE SHIELD | Admitting: Family Medicine

## 2016-08-22 ENCOUNTER — Encounter: Payer: Self-pay | Admitting: Family Medicine

## 2016-08-22 VITALS — BP 138/84 | HR 86 | Resp 16 | Ht 63.0 in | Wt 159.0 lb

## 2016-08-22 DIAGNOSIS — I10 Essential (primary) hypertension: Secondary | ICD-10-CM | POA: Diagnosis not present

## 2016-08-22 DIAGNOSIS — F17200 Nicotine dependence, unspecified, uncomplicated: Secondary | ICD-10-CM | POA: Diagnosis not present

## 2016-08-22 DIAGNOSIS — R7303 Prediabetes: Secondary | ICD-10-CM

## 2016-08-22 DIAGNOSIS — E785 Hyperlipidemia, unspecified: Secondary | ICD-10-CM | POA: Diagnosis not present

## 2016-08-22 DIAGNOSIS — J301 Allergic rhinitis due to pollen: Secondary | ICD-10-CM

## 2016-08-22 DIAGNOSIS — E8941 Symptomatic postprocedural ovarian failure: Secondary | ICD-10-CM | POA: Diagnosis not present

## 2016-08-22 DIAGNOSIS — J3089 Other allergic rhinitis: Secondary | ICD-10-CM | POA: Diagnosis not present

## 2016-08-22 DIAGNOSIS — E663 Overweight: Secondary | ICD-10-CM | POA: Diagnosis not present

## 2016-08-22 MED ORDER — MOMETASONE FUROATE 50 MCG/ACT NA SUSP: 2.0000 | Freq: Every day | NASAL | 12 refills | Status: DC

## 2016-08-22 NOTE — Patient Instructions (Addendum)
Annual physical exam in early September, call if you need me before   EKG at next visit  Fasting lipid, cmp, HBA1C, TSh, Vit D, FSH and LH, CBC 1 week before next visit  QUIT  Addiction habits by mid May, mom's day/ Grandma's day   BP is excellent  pls schedule mammogram due August 9 or after  Allergy med renewed  Thank you  for choosing [facility]. We consider it a privelige to serve you.  Delivering excellent health care in a caring and  compassionate way is our goal.  Partnering with you,  so that together we can achieve this goal is our strategy.   Please work on good  health habits so that your health will improve. 1. Commitment to daily physical activity for 30 to 60  minutes, if you are able to do this.  2. Commitment to wise food choices. Aim for half of your  food intake to be vegetable and fruit, one quarter starchy foods, and one quarter protein. Try to eat on a regular schedule  3 meals per day, snacking between meals should be limited to vegetables or fruits or small portions of nuts. 64 ounces of water per day is generally recommended, unless you have specific health conditions, like heart failure or kidney failure where you will need to limit fluid intake.  3. Commitment to sufficient and a  good quality of physical and mental rest daily, generally between 6 to 8 hours per day.  WITH PERSISTANCE AND PERSEVERANCE, THE IMPOSSIBLE , BECOMES THE NORM!

## 2016-08-23 ENCOUNTER — Encounter: Payer: Self-pay | Admitting: Family Medicine

## 2016-08-23 DIAGNOSIS — E8941 Symptomatic postprocedural ovarian failure: Secondary | ICD-10-CM | POA: Insufficient documentation

## 2016-08-23 NOTE — Assessment & Plan Note (Signed)
Hyperlipidemia:Low fat diet discussed and encouraged.   Lipid Panel  Lab Results  Component Value Date   CHOL 204 (H) 11/27/2015   HDL 59 11/27/2015   LDLCALC 123 11/27/2015   TRIG 112 11/27/2015   CHOLHDL 3.5 11/27/2015   Updated lab needed at/ before next visit.

## 2016-08-23 NOTE — Assessment & Plan Note (Signed)
Deteriorated. Patient re-educated about  the importance of commitment to a  minimum of 150 minutes of exercise per week.  The importance of healthy food choices with portion control discussed. Encouraged to start a food diary, count calories and to consider  joining a support group. Sample diet sheets offered. Goals set by the patient for the next several months.   Weight /BMI 08/22/2016 03/16/2016 12/03/2015  WEIGHT 159 lb - 155 lb  HEIGHT 5\' 3"  5\' 3"  5\' 3"   BMI 28.17 kg/m2 - 27.46 kg/m2

## 2016-08-23 NOTE — Assessment & Plan Note (Signed)
Patient educated about the importance of limiting  Carbohydrate intake , the need to commit to daily physical activity for a minimum of 30 minutes , and to commit weight loss. The fact that changes in all these areas will reduce or eliminate all together the development of diabetes is stressed.  Updated lab needed at/ before next visit.   Diabetic Labs Latest Ref Rng & Units 11/27/2015 12/02/2014 03/25/2014 12/17/2012 12/13/2012  HbA1c <5.7 % 5.6 5.8(H) - - -  Chol 125 - 200 mg/dL 204(H) 184 - - -  HDL >=46 mg/dL 59 50 - - -  Calc LDL <130 mg/dL 123 104 - - -  Triglycerides <150 mg/dL 112 152(H) - - -  Creatinine 0.50 - 1.10 mg/dL 0.71 0.61 0.75 0.64 0.69   BP/Weight 08/22/2016 03/16/2016 12/03/2015 06/24/2015 01/14/2015 08/05/2014 1/60/1093  Systolic BP 235 573 220 254 270 623 -  Diastolic BP 84 88 90 88 82 80 -  Wt. (Lbs) 159 - 155 158 158 159.04 159  BMI 28.17 - 27.46 28 28 28.61 29.07   No flowsheet data found.

## 2016-08-23 NOTE — Assessment & Plan Note (Signed)
Controlled, no change in medication DASH diet and commitment to daily physical activity for a minimum of 30 minutes discussed and encouraged, as a part of hypertension management. The importance of attaining a healthy weight is also discussed.  BP/Weight 08/22/2016 03/16/2016 12/03/2015 06/24/2015 01/14/2015 08/05/2014 12/23/2351  Systolic BP 614 431 540 086 761 950 -  Diastolic BP 84 88 90 88 82 80 -  Wt. (Lbs) 159 - 155 158 158 159.04 159  BMI 28.17 - 27.46 28 28 28.61 29.07

## 2016-08-23 NOTE — Assessment & Plan Note (Signed)
6 month h/o increased hot flashes, likely perimenopausal has one ovary removed, will check FSH and LH levels, encouraged to quit smoking also

## 2016-08-23 NOTE — Assessment & Plan Note (Signed)
increasaed symptoms with season change, nasonex prescribed

## 2016-08-23 NOTE — Progress Notes (Signed)
Kathy Stewart     MRN: [id number]      DOB: January 19, 1971   HPI Kathy Stewart is here for follow up and re-evaluation of chronic medical conditions, medication management and review of any available recent lab and radiology data.  Preventive health is updated, specifically  Cancer screening and Immunization.   Questions or concerns regarding consultations or procedures which the PT has had in the interim are  addressed. The PT denies any adverse reactions to current medications since the last visit.  There are no new concerns.  There are no specific complaints   ROS Denies recent fever or chills. Denies sinus pressure, nasal congestion, ear pain or sore throat. Denies chest congestion, productive cough or wheezing. Denies chest pains, palpitations and leg swelling Denies abdominal pain, nausea, vomiting,diarrhea or constipation.   Denies dysuria, frequency, hesitancy or incontinence. Denies joint pain, swelling and limitation in mobility. Denies headaches, seizures, numbness, or tingling. Denies depression, anxiety or insomnia. Denies skin break down or rash.   PE  BP 138/84   Pulse 86   Resp 16   Ht 5\' 3"  (1.6 m)   Wt 159 lb (72.1 kg)   LMP 10/24/2012   SpO2 99%   BMI 28.17 kg/m   Patient alert and oriented and in no cardiopulmonary distress.  HEENT: No facial asymmetry, EOMI,   oropharynx pink and moist.  Neck supple no JVD, no mass.no sinus tenderness, nasal mucosa erythematous and edematous  Chest: Clear to auscultation bilaterally.  CVS: S1, S2 no murmurs, no S3.Regular rate.  ABD: Soft non tender.   Ext: No edema  MS: Adequate ROM spine, shoulders, hips and knees.  Skin: Intact, no ulcerations or rash noted.  Psych: Good eye contact, normal affect. Memory intact not anxious or depressed appearing.  CNS: CN 2-12 intact, power,  normal throughout.no focal deficits noted.   Assessment & Plan  Essential hypertension Controlled, no change in  medication DASH diet and commitment to daily physical activity for a minimum of 30 minutes discussed and encouraged, as a part of hypertension management. The importance of attaining a healthy weight is also discussed.  BP/Weight 08/22/2016 03/16/2016 12/03/2015 06/24/2015 01/14/2015 08/05/2014 1/76/1607  Systolic BP 371 062 694 854 627 035 -  Diastolic BP 84 88 90 88 82 80 -  Wt. (Lbs) 159 - 155 158 158 159.04 159  BMI 28.17 - 27.46 28 28 28.61 29.07       NICOTINE ADDICTION Patient counseled for approximately 5 minutes regarding the health risks of ongoing nicotine use, specifically all types of cancer, heart disease, stroke and respiratory failure. The options available for help with cessation ,the behavioral changes to assist the process, and the option to either gradully reduce usage  Or abruptly stop.is also discussed. Pt is also encouraged to set specific goals in number of cigarettes used daily, as well as to set a quit date.     Overweight (BMI 25.0-29.9) Deteriorated. Patient re-educated about  the importance of commitment to a  minimum of 150 minutes of exercise per week.  The importance of healthy food choices with portion control discussed. Encouraged to start a food diary, count calories and to consider  joining a support group. Sample diet sheets offered. Goals set by the patient for the next several months.   Weight /BMI 08/22/2016 03/16/2016 12/03/2015  WEIGHT 159 lb - 155 lb  HEIGHT 5\' 3"  5\' 3"  5\' 3"   BMI 28.17 kg/m2 - 27.46 kg/m2      Hyperlipidemia  LDL goal <130 Hyperlipidemia:Low fat diet discussed and encouraged.   Lipid Panel  Lab Results  Component Value Date   CHOL 204 (H) 11/27/2015   HDL 59 11/27/2015   LDLCALC 123 11/27/2015   TRIG 112 11/27/2015   CHOLHDL 3.5 11/27/2015   Updated lab needed at/ before next visit.    Prediabetes Patient educated about the importance of limiting  Carbohydrate intake , the need to commit to daily physical  activity for a minimum of 30 minutes , and to commit weight loss. The fact that changes in all these areas will reduce or eliminate all together the development of diabetes is stressed.  Updated lab needed at/ before next visit.   Diabetic Labs Latest Ref Rng & Units 11/27/2015 12/02/2014 03/25/2014 12/17/2012 12/13/2012  HbA1c <5.7 % 5.6 5.8(H) - - -  Chol 125 - 200 mg/dL 204(H) 184 - - -  HDL >=46 mg/dL 59 50 - - -  Calc LDL <130 mg/dL 123 104 - - -  Triglycerides <150 mg/dL 112 152(H) - - -  Creatinine 0.50 - 1.10 mg/dL 0.71 0.61 0.75 0.64 0.69   BP/Weight 08/22/2016 03/16/2016 12/03/2015 06/24/2015 01/14/2015 08/05/2014 5/32/9924  Systolic BP 268 341 962 229 798 921 -  Diastolic BP 84 88 90 88 82 80 -  Wt. (Lbs) 159 - 155 158 158 159.04 159  BMI 28.17 - 27.46 28 28 28.61 29.07   No flowsheet data found.    Seasonal allergies increasaed symptoms with season change, nasonex prescribed  Hot flashes due to surgical menopause 6 month h/o increased hot flashes, likely perimenopausal has one ovary removed, will check FSH and LH levels, encouraged to quit smoking also

## 2016-08-23 NOTE — Assessment & Plan Note (Signed)

## 2016-12-23 ENCOUNTER — Other Ambulatory Visit: Payer: Self-pay | Admitting: Family Medicine

## 2016-12-24 LAB — COMPREHENSIVE METABOLIC PANEL
ALBUMIN: 4.5 g/dL (ref 3.6–5.1)
ALT: 8 U/L (ref 6–29)
AST: 12 U/L (ref 10–35)
Alkaline Phosphatase: 60 U/L (ref 33–115)
BUN: 12 mg/dL (ref 7–25)
CALCIUM: 9.6 mg/dL (ref 8.6–10.2)
CHLORIDE: 105 mmol/L (ref 98–110)
CO2: 21 mmol/L (ref 20–32)
Creat: 0.72 mg/dL (ref 0.50–1.10)
Glucose, Bld: 90 mg/dL (ref 65–99)
Potassium: 4.1 mmol/L (ref 3.5–5.3)
Sodium: 138 mmol/L (ref 135–146)
Total Bilirubin: 0.6 mg/dL (ref 0.2–1.2)
Total Protein: 6.6 g/dL (ref 6.1–8.1)

## 2016-12-24 LAB — LIPID PANEL
CHOL/HDL RATIO: 4.1 ratio (ref <5.0)
CHOLESTEROL: 207 mg/dL — AB (ref <200)
HDL: 51 mg/dL (ref >50)
LDL Cholesterol: 140 mg/dL — ABNORMAL HIGH (ref <100)
Triglycerides: 82 mg/dL (ref <150)
VLDL: 16 mg/dL (ref <30)

## 2016-12-24 LAB — TSH: TSH: 0.38 mIU/L — ABNORMAL LOW

## 2016-12-24 LAB — LUTEINIZING HORMONE: LH: 30.2 m[IU]/mL

## 2016-12-25 LAB — HEMOGLOBIN A1C
Hgb A1c MFr Bld: 5.6 % (ref <5.7)
MEAN PLASMA GLUCOSE: 114 mg/dL

## 2016-12-25 LAB — VITAMIN D 25 HYDROXY (VIT D DEFICIENCY, FRACTURES): VIT D 25 HYDROXY: 19 ng/mL — AB (ref 30–100)

## 2016-12-26 ENCOUNTER — Encounter: Payer: Self-pay | Admitting: Family Medicine

## 2016-12-26 LAB — T3, FREE: T3, Free: 3.4 pg/mL (ref 2.3–4.2)

## 2016-12-26 LAB — T4, FREE: Free T4: 1.2 ng/dL (ref 0.8–1.8)

## 2016-12-27 ENCOUNTER — Telehealth: Payer: Self-pay | Admitting: Family Medicine

## 2016-12-27 NOTE — Telephone Encounter (Signed)
Patient left message on nurse line asking for lab results.

## 2016-12-27 NOTE — Telephone Encounter (Signed)
Left message that it will be reviewed with her at her sept visit

## 2017-01-04 ENCOUNTER — Ambulatory Visit (INDEPENDENT_AMBULATORY_CARE_PROVIDER_SITE_OTHER): Payer: BLUE CROSS/BLUE SHIELD | Admitting: Family Medicine

## 2017-01-04 ENCOUNTER — Encounter: Payer: Self-pay | Admitting: Family Medicine

## 2017-01-04 VITALS — BP 180/100 | HR 71 | Ht 63.0 in | Wt 157.0 lb

## 2017-01-04 DIAGNOSIS — F17200 Nicotine dependence, unspecified, uncomplicated: Secondary | ICD-10-CM | POA: Diagnosis not present

## 2017-01-04 DIAGNOSIS — N946 Dysmenorrhea, unspecified: Secondary | ICD-10-CM

## 2017-01-04 DIAGNOSIS — Z0000 Encounter for general adult medical examination without abnormal findings: Secondary | ICD-10-CM

## 2017-01-04 DIAGNOSIS — I10 Essential (primary) hypertension: Secondary | ICD-10-CM

## 2017-01-04 DIAGNOSIS — Z23 Encounter for immunization: Secondary | ICD-10-CM

## 2017-01-04 DIAGNOSIS — Z1211 Encounter for screening for malignant neoplasm of colon: Secondary | ICD-10-CM

## 2017-01-04 MED ORDER — AMLODIPINE BESYLATE 5 MG PO TABS: 5.0000 mg | ORAL_TABLET | Freq: Every day | ORAL | 4 refills | Status: DC

## 2017-01-04 NOTE — Patient Instructions (Addendum)
Nurse bP check first week in October.  MD follow up 2nd week in December  Please work on increasing intake of vegetable and fruit and reducing salt, fried and fatty foods, blood pressure and cholestenol are high.  New for blood pressure is amlodipine 5 mg one daily, continue the medication you are currently taking  also  Need to stop smoking   Take oTC once daily vitamin D3 2000IU        Steps to Quit Smoking Smoking tobacco can be bad for your health. It can also affect almost every organ in your body. Smoking puts you and people around you at risk for many serious long-lasting (chronic) diseases. Quitting smoking is hard, but it is one of the best things that you can do for your health. It is never too late to quit. What are the benefits of quitting smoking? When you quit smoking, you lower your risk for getting serious diseases and conditions. They can include:  Lung cancer or lung disease.  Heart disease.  Stroke.  Heart attack.  Not being able to have children (infertility).  Weak bones (osteoporosis) and broken bones (fractures).  If you have coughing, wheezing, and shortness of breath, those symptoms may get better when you quit. You may also get sick less often. If you are pregnant, quitting smoking can help to lower your chances of having a baby of low birth weight. What can I do to help me quit smoking? Talk with your doctor about what can help you quit smoking. Some things you can do (strategies) include:  Quitting smoking totally, instead of slowly cutting back how much you smoke over a period of time.  Going to in-person counseling. You are more likely to quit if you go to many counseling sessions.  Using resources and support systems, such as: ? Database administrator with a Social worker. ? Phone quitlines. ? Careers information officer. ? Support groups or group counseling. ? Text messaging programs. ? Mobile phone apps or applications.  Taking medicines. Some of  these medicines may have nicotine in them. If you are pregnant or breastfeeding, do not take any medicines to quit smoking unless your doctor says it is okay. Talk with your doctor about counseling or other things that can help you.  Talk with your doctor about using more than one strategy at the same time, such as taking medicines while you are also going to in-person counseling. This can help make quitting easier. What things can I do to make it easier to quit? Quitting smoking might feel very hard at first, but there is a lot that you can do to make it easier. Take these steps:  Talk to your family and friends. Ask them to support and encourage you.  Call phone quitlines, reach out to support groups, or work with a Social worker.  Ask people who smoke to not smoke around you.  Avoid places that make you want (trigger) to smoke, such as: ? Bars. ? Parties. ? Smoke-break areas at work.  Spend time with people who do not smoke.  Lower the stress in your life. Stress can make you want to smoke. Try these things to help your stress: ? Getting regular exercise. ? Deep-breathing exercises. ? Yoga. ? Meditating. ? Doing a body scan. To do this, close your eyes, focus on one area of your body at a time from head to toe, and notice which parts of your body are tense. Try to relax the muscles in those areas.  Download  or buy apps on your mobile phone or tablet that can help you stick to your quit plan. There are many free apps, such as QuitGuide from the [employer] Office manager for Disease Control and Prevention). You can find more support from smokefree.gov and other websites.  This information is not intended to replace advice given to you by your health care provider. Make sure you discuss any questions you have with your health care provider. Document Released: 02/11/2009 Document Revised: 12/14/2015 Document Reviewed: 09/01/2014 Elsevier Interactive Patient Education  2018 [employer].  Hypertension Hypertension is another name for high blood pressure. High blood pressure forces your heart to work harder to pump blood. This can cause problems over time. There are two numbers in a blood pressure reading. There is a top number (systolic) over a bottom number (diastolic). It is best to have a blood pressure below 120/80. Healthy choices can help lower your blood pressure. You may need medicine to help lower your blood pressure if:  Your blood pressure cannot be lowered with healthy choices.  Your blood pressure is higher than 130/80.  Follow these instructions at home: Eating and drinking  If directed, follow the DASH eating plan. This diet includes: ? Filling half of your plate at each meal with fruits and vegetables. ? Filling one quarter of your plate at each meal with whole grains. Whole grains include whole wheat pasta, brown rice, and whole grain bread. ? Eating or drinking low-fat dairy products, such as skim milk or low-fat yogurt. ? Filling one quarter of your plate at each meal with low-fat (lean) proteins. Low-fat proteins include fish, skinless chicken, eggs, beans, and tofu. ? Avoiding fatty meat, cured and processed meat, or chicken with skin. ? Avoiding premade or processed food.  Eat less than 1,500 mg of salt (sodium) a day.  Limit alcohol use to no more than 1 drink a day for nonpregnant women and 2 drinks a day for men. One drink equals 12 oz of beer, 5 oz of wine, or 1 oz of hard liquor. Lifestyle  Work with your doctor to stay at a healthy weight or to lose weight. Ask your doctor what the best weight is for you.  Get at least 30 minutes of exercise that causes your heart to beat faster (aerobic exercise) most days of the week. This may include walking, swimming, or biking.  Get at least 30 minutes of exercise that strengthens your muscles (resistance exercise) at least 3 days a week. This may include lifting weights or pilates.  Do not use  any products that contain nicotine or tobacco. This includes cigarettes and e-cigarettes. If you need help quitting, ask your doctor.  Check your blood pressure at home as told by your doctor.  Keep all follow-up visits as told by your doctor. This is important. Medicines  Take over-the-counter and prescription medicines only as told by your doctor. Follow directions carefully.  Do not skip doses of blood pressure medicine. The medicine does not work as well if you skip doses. Skipping doses also puts you at risk for problems.  Ask your doctor about side effects or reactions to medicines that you should watch for. Contact a doctor if:  You think you are having a reaction to the medicine you are taking.  You have headaches that keep coming back (recurring).  You feel dizzy.  You have swelling in your ankles.  You have trouble with your vision. Get help right away if:  You get a very  bad headache.  You start to feel confused.  You feel weak or numb.  You feel faint.  You get very bad pain in your: ? Chest. ? Belly (abdomen).  You throw up (vomit) more than once.  You have trouble breathing. Summary  Hypertension is another name for high blood pressure.  Making healthy choices can help lower blood pressure. If your blood pressure cannot be controlled with healthy choices, you may need to take medicine. This information is not intended to replace advice given to you by your health care provider. Make sure you discuss any questions you have with your health care provider. Document Released: 10/04/2007 Document Revised: 03/15/2016 Document Reviewed: 03/15/2016 Elsevier Interactive Patient Education  [employer].

## 2017-01-07 NOTE — Assessment & Plan Note (Addendum)
Uncontrolled. Medicaation adjustment made and woprk excuse x 2 days with 4 to 6week f/u DASH diet and commitment to daily physical activity for a minimum of 30 minutes discussed and encouraged, as a part of hypertension management. The importance of attaining a healthy weight is also discussed. Work excuse x 2 days BP/Weight 01/04/2017 08/22/2016 03/16/2016 12/03/2015 06/24/2015 1/77/9390 3/0/0923  Systolic BP 300 762 263 335 456 256 389  Diastolic BP 373 84 88 90 88 82 80  Wt. (Lbs) 157 159 - 155 158 158 159.04  BMI 27.81 28.17 - 27.46 28 28 28.61

## 2017-01-07 NOTE — Progress Notes (Signed)
Kathy Stewart     MRN: [id number]      DOB: April 26, 1971  HPI: Patient is in for annual physical exam. C/o increased stress, still smoking and blood pressure noted to be markedly elevated Recent labs, if available are reviewed. Immunization is reviewed , and  updated .   PE: BP (!) 180/100 (BP Location: Left Arm, Patient Position: Sitting, Cuff Size: Normal)   Pulse 71   Ht 5\' 3"  (1.6 m)   Wt 157 lb (71.2 kg)   LMP 10/24/2012   SpO2 99%   BMI 27.81 kg/m  Pleasant  female, alert and oriented x 3, in no cardio-pulmonary distress. Afebrile. HEENT No facial trauma or asymetry. Sinuses non tender.  Extra occullar muscles intact,  External ears normal, tympanic membranes clear. Oropharynx moist, no exudate. Neck: supple, no adenopathy,JVD or thyromegaly.No bruits.  Chest: Clear to ascultation bilaterally.No crackles or wheezes. Non tender to palpation  Breast: No asymetry,no masses or lumps. No tenderness. No nipple discharge or inversion. No axillary or supraclavicular adenopathy  Cardiovascular system; Heart sounds normal,  S1 and  S2 ,no S3.  No murmur, or thrill. Apical beat not displaced Peripheral pulses normal.  Abdomen: Soft, non tender, no organomegaly or masses. No bruits. Bowel sounds normal. No guarding, tenderness or rebound.  Rectal:  Normal sphincter tone. No rectal mass. Guaiac negative stool.  GU: Not examined  Musculoskeletal exam: Full ROM of spine, hips , shoulders and knees. No deformity ,swelling or crepitus noted. No muscle wasting or atrophy.   Neurologic: Cranial nerves 2 to 12 intact. Power, tone ,sensation and reflexes normal throughout. No disturbance in gait. No tremor.  Skin: Intact, no ulceration, erythema , scaling or rash noted. Pigmentation normal throughout  Psych; Normal mood and affect. Judgement and concentration normal   Assessment & Plan:  Annual physical exam Annual exam as documented.  Immunization  and cancer screening needs are specifically addressed at this visit.   NICOTINE ADDICTION Patient is asked and  confirms current  Nicotine use.  Five to seven minutes of time is spent in counseling the patient of the need to quit smoking  Advice to quit is delivered clearly specifically in reducing the risk of developing heart disease, having a stroke, or of developing all types of cancer, especially lung and oral cancer. Improvement in breathing and exercise tolerance and quality of life is also discussed, as is the economic benefit.  Assessment of willingness to quit or to make an attempt to quit is made and documented  Assistance in quit attempt is made with several and varied options presented, based on patient's desire and need. These include  literature, local classes available, 1800 QUIT NOW number, OTC and prescription medication.  The GOAL to be NICOTINE FREE is re emphasized.  The patient has set a personal goal of either reduction or discontinuation and follow up is arranged between 6 an 16 weeks.    Essential hypertension Uncontrolled. Medicaation adjustment made and woprk excuse x 2 days with 4 to 6week f/u DASH diet and commitment to daily physical activity for a minimum of 30 minutes discussed and encouraged, as a part of hypertension management. The importance of attaining a healthy weight is also discussed.  BP/Weight 01/04/2017 08/22/2016 03/16/2016 12/03/2015 06/24/2015 08/07/8117 05/05/7827  Systolic BP 562 130 865 784 696 295 284  Diastolic BP 132 84 88 90 88 82 80  Wt. (Lbs) 157 159 - 155 158 158 159.04  BMI 27.81 28.17 - 27.46 28 28  28.61        

## 2017-01-07 NOTE — Assessment & Plan Note (Signed)

## 2017-01-07 NOTE — Assessment & Plan Note (Signed)
Annual exam as documented. . Immunization and cancer screening needs are specifically addressed at this visit.  

## 2017-01-08 LAB — HEMOCCULT GUIAC POC 1CARD (OFFICE): FECAL OCCULT BLD: NEGATIVE

## 2017-02-02 ENCOUNTER — Other Ambulatory Visit: Payer: Self-pay | Admitting: Family Medicine

## 2017-02-02 DIAGNOSIS — Z1231 Encounter for screening mammogram for malignant neoplasm of breast: Secondary | ICD-10-CM

## 2017-02-03 ENCOUNTER — Other Ambulatory Visit: Payer: Self-pay | Admitting: Family Medicine

## 2017-02-05 ENCOUNTER — Telehealth: Payer: Self-pay | Admitting: Family Medicine

## 2017-02-05 NOTE — Telephone Encounter (Signed)
Patient is requesting refill for atenolol

## 2017-02-05 NOTE — Telephone Encounter (Signed)
Seen [phone] 

## 2017-02-05 NOTE — Telephone Encounter (Signed)
Refilled this morning.

## 2017-02-07 ENCOUNTER — Ambulatory Visit (HOSPITAL_COMMUNITY): Payer: BLUE CROSS/BLUE SHIELD

## 2017-02-26 ENCOUNTER — Encounter (HOSPITAL_COMMUNITY): Payer: Self-pay

## 2017-02-26 ENCOUNTER — Ambulatory Visit (HOSPITAL_COMMUNITY)
Admission: RE | Admit: 2017-02-26 | Discharge: 2017-02-26 | Disposition: A | Discharge status: Home / Self Care | Payer: BLUE CROSS/BLUE SHIELD | Source: Ambulatory Visit | Attending: Family Medicine | Admitting: Family Medicine

## 2017-02-26 DIAGNOSIS — Z1231 Encounter for screening mammogram for malignant neoplasm of breast: Secondary | ICD-10-CM | POA: Insufficient documentation

## 2017-09-12 ENCOUNTER — Other Ambulatory Visit: Payer: Self-pay | Admitting: Family Medicine

## 2017-09-18 ENCOUNTER — Other Ambulatory Visit: Payer: Self-pay | Admitting: Family Medicine

## 2017-10-18 ENCOUNTER — Telehealth: Payer: Self-pay | Admitting: Family Medicine

## 2017-10-18 DIAGNOSIS — I10 Essential (primary) hypertension: Secondary | ICD-10-CM

## 2017-10-18 DIAGNOSIS — R7303 Prediabetes: Secondary | ICD-10-CM

## 2017-10-18 DIAGNOSIS — E559 Vitamin D deficiency, unspecified: Secondary | ICD-10-CM

## 2017-10-18 DIAGNOSIS — E785 Hyperlipidemia, unspecified: Secondary | ICD-10-CM

## 2017-10-18 NOTE — Telephone Encounter (Signed)
pls order CBC, fasting lipid, chem 7, TSH and vit D

## 2017-10-18 NOTE — Telephone Encounter (Signed)
What labs would you like to order for her sept visit?

## 2017-10-18 NOTE — Telephone Encounter (Signed)
[facility] CPE for sept --please call her if she needs blood work completed

## 2017-10-19 NOTE — Telephone Encounter (Signed)
Mailed to her

## 2017-12-13 ENCOUNTER — Other Ambulatory Visit: Payer: Self-pay | Admitting: Family Medicine

## 2017-12-15 ENCOUNTER — Emergency Department (HOSPITAL_COMMUNITY)
Admission: EM | Admit: 2017-12-15 | Discharge: 2017-12-15 | Disposition: A | Discharge status: Home / Self Care | Payer: BLUE CROSS/BLUE SHIELD | Attending: Emergency Medicine | Admitting: Emergency Medicine

## 2017-12-15 ENCOUNTER — Encounter (HOSPITAL_COMMUNITY): Payer: Self-pay | Admitting: Emergency Medicine

## 2017-12-15 ENCOUNTER — Other Ambulatory Visit: Payer: Self-pay

## 2017-12-15 DIAGNOSIS — R21 Rash and other nonspecific skin eruption: Secondary | ICD-10-CM | POA: Insufficient documentation

## 2017-12-15 DIAGNOSIS — F17210 Nicotine dependence, cigarettes, uncomplicated: Secondary | ICD-10-CM | POA: Insufficient documentation

## 2017-12-15 DIAGNOSIS — Z79899 Other long term (current) drug therapy: Secondary | ICD-10-CM | POA: Insufficient documentation

## 2017-12-15 DIAGNOSIS — I10 Essential (primary) hypertension: Secondary | ICD-10-CM | POA: Insufficient documentation

## 2017-12-15 MED ORDER — HYDROXYZINE HCL 25 MG PO TABS: 25.0000 mg | ORAL_TABLET | Freq: Four times a day (QID) | ORAL | 0 refills | Status: DC

## 2017-12-15 MED ORDER — PREDNISONE 50 MG PO TABS
60.0000 mg | ORAL_TABLET | Freq: Once | ORAL | Status: AC
  Administered 2017-12-15: 60 mg via ORAL
  Filled 2017-12-15: qty 1

## 2017-12-15 MED ORDER — CEPHALEXIN 500 MG PO CAPS: 500.0000 mg | ORAL_CAPSULE | Freq: Four times a day (QID) | ORAL | 0 refills | Status: DC

## 2017-12-15 MED ORDER — PREDNISONE 50 MG PO TABS: 50.0000 mg | ORAL_TABLET | Freq: Every day | ORAL | 0 refills | Status: AC

## 2017-12-15 MED ORDER — HYDROXYZINE HCL 50 MG/ML IM SOLN
50.0000 mg | Freq: Once | INTRAMUSCULAR | Status: AC
  Administered 2017-12-15: 50 mg via INTRAMUSCULAR
  Filled 2017-12-15: qty 1

## 2017-12-15 MED ORDER — CEPHALEXIN 500 MG PO CAPS
500.0000 mg | ORAL_CAPSULE | Freq: Once | ORAL | Status: AC
  Administered 2017-12-15: 500 mg via ORAL
  Filled 2017-12-15: qty 1

## 2017-12-15 NOTE — ED Provider Notes (Signed)
[facility] Provider Note   CSN: [id number] Arrival date & time: 12/15/17  1636     History   Chief Complaint Chief Complaint  Patient presents with  . Rash    HPI Kathy Stewart is a 47 y.o. female.  Kathy Stewart is a 47 y.o. Female with history of hypertension, anemia and seasonal allergies, who presents to the emergency department for evaluation of rash over her entire trunk, upper extremities neck and face that started about 5 days ago.  Patient reports the rash started as several small raised bumps, but if you have the areas where she has been scratching have now scabbed over.  She reports the rash is extremely pruritic and the itching has been keeping her from sleeping, no pain from the rash.  No associated fevers or chills, myalgias, nausea or vomiting.  She reports trying Lotrimin over the rash without relief.  She also try to putting Clorox on some of the areas that were scabbing over but this only made things worse.  She reports scabbed areas over her left wrist, right upper arm and back, but all other areas just seem to be small bumps, without any drainage.  She has not tried anything else to treat her symptoms.  She denies any new household products, foods or medications.  No associated facial swelling or shortness of breath.     Past Medical History:  Diagnosis Date  . Allergy 1990   spring allergies  . Anemia   . Hypertension 01/2014  . Nicotine dependence 1997   current in 2016 approx 5 per day    Patient Active Problem List   Diagnosis Date Noted  . Hot flashes due to surgical menopause 08/23/2016  . Hyperlipidemia LDL goal <130 12/05/2015  . Anemia, deficiency 11/26/2015  . Cervical neck pain with evidence of disc disease 06/24/2015  . Annual physical exam 01/14/2015  . Prediabetes 08/11/2014  . Overweight (BMI 25.0-29.9) 08/11/2014  . Essential hypertension 08/05/2014  . Seasonal allergies 08/05/2014  . NICOTINE ADDICTION  05/21/2007  . INSOMNIA 05/21/2007    Past Surgical History:  Procedure Laterality Date  . ABDOMINAL HYSTERECTOMY  11/2012   fibroids, has one ovary  . BILATERAL SALPINGECTOMY Bilateral 12/17/2012   Procedure: BILATERAL SALPINGECTOMY;  Surgeon: Jonnie Kind, MD;  Location: [facility];  Service: Gynecology;  Laterality: Bilateral;  . CESAREAN SECTION  1992  . SALPINGOOPHORECTOMY Left 12/17/2012   Procedure: SALPINGO OOPHORECTOMY;  Surgeon: Jonnie Kind, MD;  Location: [facility];  Service: Gynecology;  Laterality: Left;  . SUPRACERVICAL ABDOMINAL HYSTERECTOMY N/A 12/17/2012   Procedure: ABDOMINAL SUPRACERVICAL HYSTERECTOMY;  Surgeon: Jonnie Kind, MD;  Location: [facility];  Service: Gynecology;  Laterality: N/A;  . TONSILLECTOMY       OB History   None      Home Medications    Prior to Admission medications   Medication Sig Start Date End Date Taking? Authorizing Provider  amLODipine (NORVASC) 5 MG tablet Take 1 tablet (5 mg total) by mouth daily. 01/04/17   Fayrene Helper, MD  atenolol (TENORMIN) 50 MG tablet TAKE 1 TABLET BY MOUTH ONCE DAILY 09/18/17   Fayrene Helper, MD  ibuprofen (ADVIL,MOTRIN) 800 MG tablet TAKE ONE TABLET BY MOUTH EVERY 8 HOURS AS NEEDED 04/03/16   Fayrene Helper, MD  ibuprofen (ADVIL,MOTRIN) 800 MG tablet TAKE ONE TABLET BY MOUTH EVERY 8 HOURS AS NEEDED 10/05/17   Fayrene Helper, MD  mometasone (NASONEX) 50 MCG/ACT nasal spray  Place 2 sprays into the nose daily. 08/22/16   Fayrene Helper, MD    Family History Family History  Problem Relation Age of Onset  . Hypertension Mother   . Kidney disease Mother   . Cancer Father        bladder  . Hypertension Maternal Grandmother   . Diabetes Maternal Grandmother   . Cataracts Maternal Grandmother     Social History Social History   Tobacco Use  . Smoking status: Current Every Day Smoker    Packs/day: 0.25    Years: 17.00    Pack years: 4.25    Types: Cigarettes  . Smokeless tobacco:  Never Used  Substance Use Topics  . Alcohol use: Yes    Comment: social  . Drug use: No     Allergies   Oxycodone-acetaminophen   Review of Systems Review of Systems  Constitutional: Negative for chills and fever.  HENT: Negative for facial swelling.   Eyes: Negative for pain, discharge and visual disturbance.  Respiratory: Negative for shortness of breath.   Cardiovascular: Negative for chest pain.  Gastrointestinal: Negative for nausea.  Musculoskeletal: Negative for arthralgias and myalgias.  Skin: Positive for rash.  All other systems reviewed and are negative.    Physical Exam Updated Vital Signs BP (!) 150/91 (BP Location: Right Arm)   Pulse 98   Temp 98.6 F (37 C) (Oral)   Resp 18   Ht 5\' 3"  (1.6 m)   Wt 72.1 kg   LMP 10/24/2012   BMI 28.17 kg/m   Physical Exam  Constitutional: She appears well-developed and well-nourished. No distress.  HENT:  Head: Normocephalic and atraumatic.  Mouth/Throat: Oropharynx is clear and moist.  No lesions noted on the oral mucosa  Eyes: Right eye exhibits no discharge. Left eye exhibits no discharge.  Neck: Neck supple.  Cardiovascular: Normal rate, regular rhythm, normal heart sounds and intact distal pulses.  Pulmonary/Chest: Effort normal and breath sounds normal. No respiratory distress.  Respirations equal and unlabored, patient able to speak in full sentences, lungs clear to auscultation bilaterally  Musculoskeletal: She exhibits no edema or deformity.  Neurological: She is alert. Coordination normal.  Skin: Skin is warm and dry. Capillary refill takes less than 2 seconds. Rash noted. She is not diaphoretic.  Diffuse rash over the trunk, upper extremities and neck extending onto the lower portions of the face, small raised red bumps, no vesicles, pustules or petechiae.  There are 3 areas with scabbing and crusting with slight amount of erythema concern for superimposed infection.  Rash spares the palms and soles, no  mucosal involvement.  Psychiatric: She has a normal mood and affect. Her behavior is normal.  Nursing note and vitals reviewed.        ED Treatments / Results  Labs (all labs ordered are listed, but only abnormal results are displayed) Labs Reviewed - No data to display  EKG None  Radiology No results found.  Procedures Procedures (including critical care time)  Medications Ordered in ED Medications  hydrOXYzine (VISTARIL) injection 50 mg (50 mg Intramuscular Given 12/15/17 1739)  cephALEXin (KEFLEX) capsule 500 mg (500 mg Oral Given 12/15/17 1738)  predniSONE (DELTASONE) tablet 60 mg (60 mg Oral Given 12/15/17 1738)     Initial Impression / Assessment and Plan / ED Course  I have reviewed the triage vital signs and the nursing notes.  Pertinent labs & imaging results that were available during my care of the patient were reviewed by me and considered  in my medical decision making (see chart for details).  Rash consistent with Dermatitis. Patient denies any difficulty breathing or swallowing.  Pt has a patent airway without stridor and is handling secretions without difficulty; no angioedema. No blisters, no pustules, no warmth, no draining sinus tracts, no superficial abscesses, no bullous impetigo, no vesicles, no desquamation, no target lesions with dusky purpura or a central bulla. Not tender to touch.  There are 3 scabbed over areas that are slightly concerning for superimposed infection from scratching, no underlying fluctuance to suggest abscess.  Will treat with Keflex.  No concern for SJS, TEN, TSS, tick borne illness, syphilis or other life-threatening condition. Will discharge home with short course of steroids, hydroxyzine as needed for pruritis.  Good Rx coupons provided for prescriptions.  Patient to follow-up with her primary care provider in 2 to 3 days for recheck.  Return precautions discussed.   Final Clinical Impressions(s) / ED Diagnoses   Final diagnoses:    Rash    ED Discharge Orders         Ordered    predniSONE (DELTASONE) 50 MG tablet  Daily     12/15/17 1807    cephALEXin (KEFLEX) 500 MG capsule  4 times daily     12/15/17 1807    hydrOXYzine (ATARAX/VISTARIL) 25 MG tablet  Every 6 hours     12/15/17 1807           Janet Berlin 12/15/17 1900    Davonna Belling, MD 12/15/17 2315

## 2017-12-15 NOTE — ED Triage Notes (Signed)
Patient c/o rash to entire body that started x1 week ago. Per patient itching patient reports using Lotrimin with no relief. Denies any fevers or difficulty breathing,

## 2017-12-15 NOTE — Discharge Instructions (Signed)
Your rashes are likely caused by a fungus please discontinue the use of Lotrimin.  You may use hydroxyzine every 6 hours as needed for itching.  You can also use cold compresses over the areas that are bothering you the most.  Please take prednisone once daily for the next 4 days, and Keflex 4 times daily for 5 days to treat the localized infections where there is scabbing.  These follow-up with your primary care doctor on Monday or Tuesday for recheck to see if rash is improving.  Return to the emergency department for fevers, significantly worsened rash, skin peeling, nausea, vomiting, muscle aches or any other new or concerning symptoms.

## 2017-12-18 ENCOUNTER — Telehealth: Payer: Self-pay | Admitting: Family Medicine

## 2017-12-18 NOTE — Telephone Encounter (Signed)
New Message  Left VM for pt to come in this week per Dr. Moshe Cipro. Placed her on schedule for tomorrow at 220 pm if she does not come in or call by 5 pm today I will cx appt.

## 2017-12-19 ENCOUNTER — Ambulatory Visit: Payer: Self-pay | Admitting: Family Medicine

## 2018-01-10 ENCOUNTER — Encounter: Payer: BLUE CROSS/BLUE SHIELD | Admitting: Family Medicine

## 2018-03-19 ENCOUNTER — Other Ambulatory Visit: Payer: Self-pay | Admitting: Family Medicine

## 2018-04-02 ENCOUNTER — Other Ambulatory Visit: Payer: Self-pay

## 2018-04-02 DIAGNOSIS — I10 Essential (primary) hypertension: Secondary | ICD-10-CM

## 2018-04-02 MED ORDER — ATENOLOL 50 MG PO TABS: 50.0000 mg | ORAL_TABLET | Freq: Every day | ORAL | 2 refills | Status: DC

## 2018-04-02 NOTE — Progress Notes (Signed)
Patient's Atenolol ordered

## 2018-12-19 ENCOUNTER — Ambulatory Visit (INDEPENDENT_AMBULATORY_CARE_PROVIDER_SITE_OTHER): Payer: Self-pay | Admitting: Family Medicine

## 2018-12-19 ENCOUNTER — Encounter (INDEPENDENT_AMBULATORY_CARE_PROVIDER_SITE_OTHER): Payer: Self-pay

## 2018-12-19 ENCOUNTER — Other Ambulatory Visit (HOSPITAL_COMMUNITY)
Admission: RE | Admit: 2018-12-19 | Discharge: 2018-12-19 | Disposition: A | Discharge status: Home / Self Care | Payer: Self-pay | Source: Ambulatory Visit | Attending: Family Medicine | Admitting: Family Medicine

## 2018-12-19 ENCOUNTER — Encounter: Payer: Self-pay | Admitting: Family Medicine

## 2018-12-19 ENCOUNTER — Other Ambulatory Visit: Payer: Self-pay

## 2018-12-19 VITALS — BP 124/84 | HR 72 | Temp 98.7°F | Resp 15 | Ht 62.5 in | Wt 162.0 lb

## 2018-12-19 DIAGNOSIS — F17200 Nicotine dependence, unspecified, uncomplicated: Secondary | ICD-10-CM

## 2018-12-19 DIAGNOSIS — E663 Overweight: Secondary | ICD-10-CM

## 2018-12-19 DIAGNOSIS — Z0000 Encounter for general adult medical examination without abnormal findings: Secondary | ICD-10-CM

## 2018-12-19 DIAGNOSIS — Z124 Encounter for screening for malignant neoplasm of cervix: Secondary | ICD-10-CM

## 2018-12-19 MED ORDER — ATENOLOL 50 MG PO TABS: 50.0000 mg | ORAL_TABLET | Freq: Every day | ORAL | 3 refills | Status: DC

## 2018-12-19 NOTE — Assessment & Plan Note (Signed)
Asked:confirms currently smokes cigarettes Assess: Unwilling to quit but cutting back Advise: needs to QUIT to reduce risk of cancer, cardio and cerebrovascular disease Assist: counseled for 5 minutes and literature provided Arrange: follow up in 3 months  

## 2018-12-19 NOTE — Assessment & Plan Note (Signed)

## 2018-12-19 NOTE — Progress Notes (Signed)
Kathy Stewart     MRN: [id number]      DOB: 10-24-1970  HPI: Patient is in for annual physical exam. No other health concerns are expressed or addressed at the visit. Recent labs, if available are reviewed. Immunization is reviewed , and  updated if needed.   PE: BP 124/84   Pulse 72   Temp 98.7 F (37.1 C) (Temporal)   Resp 15   Ht 5' 2.5" (1.588 m)   Wt 162 lb (73.5 kg)   LMP 10/24/2012   SpO2 98%   BMI 29.16 kg/m   Pleasant  female, alert and oriented x 3, in no cardio-pulmonary distress. Afebrile. HEENT No facial trauma or asymetry. Sinuses non tender.  Extra occullar muscles intact, . External ears normal, tympanic membranes bilaterally occluded, successful ear irrigation by nurse. . Neck: supple, no adenopathy,JVD or thyromegaly.No bruits.  Chest: Clear to ascultation bilaterally.No crackles or wheezes. Non tender to palpation  Breast: No asymetry,no masses or lumps. No tenderness. No nipple discharge or inversion. No axillary or supraclavicular adenopathy  Cardiovascular system; Heart sounds normal,  S1 and  S2 ,no S3.  No murmur, or thrill. Apical beat not displaced Peripheral pulses normal.  Abdomen: Soft, non tender, no organomegaly or masses. No bruits. Bowel sounds normal. No guarding, tenderness or rebound.  GU: External genitalia normal female genitalia , normal female distribution of hair. No lesions. Urethral meatus normal in size, no  Prolapse, no lesions visibly  Present. Bladder non tender. Vagina pink and moist , with no visible lesions , discharge present . Adequate pelvic support no  cystocele or rectocele noted Uterus absent, no adnexal mass or tenderness  Musculoskeletal exam: Full ROM of spine, hips , shoulders and knees. No deformity ,swelling or crepitus noted. No muscle wasting or atrophy.   Neurologic: Cranial nerves 2 to 12 intact. Power, tone , normal throughout. No disturbance in gait. No tremor.  Skin:  Intact, no ulceration, erythema , scaling or rash noted. Pigmentation normal throughout  Psych; Normal mood and affect. Judgement and concentration normal   Assessment & Plan:  Annual physical exam Annual exam as documented. Counseling done  re healthy lifestyle involving commitment to 150 minutes exercise per week, heart healthy diet, and attaining healthy weight.The importance of adequate sleep also discussed. Regular seat belt use and home safety, is also discussed. Changes in health habits are decided on by the patient with goals and time frames  set for achieving them. Immunization and cancer screening needs are specifically addressed at this visit.   NICOTINE ADDICTION Asked:confirms currently smokes cigarettes Assess: Unwilling to quit but cutting back Advise: needs to QUIT to reduce risk of cancer, cardio and cerebrovascular disease Assist: counseled for 5 minutes and literature provided Arrange: follow up in 3 months   Overweight (BMI 25.0-29.9)  Patient re-educated about  the importance of commitment to a  minimum of 150 minutes of exercise per week as able.  The importance of healthy food choices with portion control discussed, as well as eating regularly and within a 12 hour window most days. The need to choose "clean , green" food 50 to 75% of the time is discussed, as well as to make water the primary drink and set a goal of 64 ounces water daily.    Weight /BMI 12/19/2018 12/15/2017 01/04/2017  WEIGHT 162 lb 159 lb 157 lb  HEIGHT 5' 2.5" 5\' 3"  5\' 3"   BMI 29.16 kg/m2 28.17 kg/m2 27.81 kg/m2

## 2018-12-19 NOTE — Assessment & Plan Note (Signed)
  Patient re-educated about  the importance of commitment to a  minimum of 150 minutes of exercise per week as able.  The importance of healthy food choices with portion control discussed, as well as eating regularly and within a 12 hour window most days. The need to choose "clean , green" food 50 to 75% of the time is discussed, as well as to make water the primary drink and set a goal of 64 ounces water daily.    Weight /BMI 12/19/2018 12/15/2017 01/04/2017  WEIGHT 162 lb 159 lb 157 lb  HEIGHT 5' 2.5" 5\' 3"  5\' 3"   BMI 29.16 kg/m2 28.17 kg/m2 27.81 kg/m2

## 2018-12-19 NOTE — Patient Instructions (Signed)
Annual physical exam in 1 year, no papo, call if you need need  Before  Please give info on applying for/ arranging  free mammogram  Bilateral ear flush today  Please reduce fried and fatty foods, cholesterol is hogh  Please work on quitting smoking  Please continue to reduce beer  It is important that you exercise regularly at least 30 minutes 5 times a week. If you develop chest pain, have severe difficulty breathing, or feel very tired, stop exercising immediately and seek medical attention  Think about what you will eat, plan ahead. Choose " clean, green, fresh or frozen" over canned, processed or packaged foods which are more sugary, salty and fatty. 70 to 75% of food eaten should be vegetables and fruit. Three meals at set times with snacks allowed between meals, but they must be fruit or vegetables. Aim to eat over a 12 hour period , example 7 am to 7 pm, and STOP after  your last meal of the day. Drink water,generally about 64 ounces per day, no other drink is as healthy. Fruit juice is best enjoyed in a healthy way, by EATING the fruit. Weight loss goal of 10 pounds

## 2018-12-19 NOTE — Addendum Note (Signed)
Addended by: Eual Fines on: 12/19/2018 09:36 AM   Modules accepted: Orders

## 2018-12-20 LAB — CYTOLOGY - PAP: Diagnosis: NEGATIVE

## 2018-12-23 ENCOUNTER — Encounter: Payer: Self-pay | Admitting: Family Medicine

## 2018-12-25 ENCOUNTER — Other Ambulatory Visit: Payer: Self-pay | Admitting: Family Medicine

## 2018-12-25 DIAGNOSIS — Z1231 Encounter for screening mammogram for malignant neoplasm of breast: Secondary | ICD-10-CM

## 2019-02-13 ENCOUNTER — Ambulatory Visit
Admission: RE | Admit: 2019-02-13 | Discharge: 2019-02-13 | Disposition: A | Discharge status: Home / Self Care | Payer: No Typology Code available for payment source | Source: Ambulatory Visit | Attending: Family Medicine | Admitting: Family Medicine

## 2019-02-13 ENCOUNTER — Other Ambulatory Visit: Payer: Self-pay

## 2019-02-13 DIAGNOSIS — Z1231 Encounter for screening mammogram for malignant neoplasm of breast: Secondary | ICD-10-CM

## 2019-04-17 ENCOUNTER — Other Ambulatory Visit: Payer: Self-pay | Admitting: Family Medicine

## 2019-12-25 ENCOUNTER — Encounter: Payer: Self-pay | Admitting: Family Medicine

## 2020-01-01 ENCOUNTER — Telehealth: Payer: Self-pay

## 2020-01-01 NOTE — Telephone Encounter (Signed)
Wants to know if u think she should get the booster shot for covid

## 2020-01-02 NOTE — Telephone Encounter (Signed)
LVM for pt to call the office.

## 2020-01-02 NOTE — Telephone Encounter (Signed)
Yes booster is recommended 8 months after 2nd vaccine, assuming she got [employer] or [employer]

## 2020-01-23 ENCOUNTER — Other Ambulatory Visit: Payer: Self-pay | Admitting: Family Medicine

## 2020-03-03 ENCOUNTER — Telehealth: Payer: Self-pay

## 2020-03-03 NOTE — Telephone Encounter (Signed)
Telephoned patient at home number. Patient answered and unavailable at this time. Patient requested that I call again and leave a voice message with [facility] contact information.

## 2020-03-17 ENCOUNTER — Ambulatory Visit: Payer: No Typology Code available for payment source | Attending: Family

## 2020-03-17 DIAGNOSIS — Z23 Encounter for immunization: Secondary | ICD-10-CM

## 2020-04-17 ENCOUNTER — Other Ambulatory Visit: Payer: Self-pay | Admitting: Family Medicine

## 2020-04-21 ENCOUNTER — Other Ambulatory Visit: Payer: Self-pay | Admitting: Family Medicine

## 2020-04-21 DIAGNOSIS — Z1231 Encounter for screening mammogram for malignant neoplasm of breast: Secondary | ICD-10-CM

## 2020-05-06 ENCOUNTER — Ambulatory Visit
Admission: RE | Admit: 2020-05-06 | Discharge: 2020-05-06 | Disposition: A | Discharge status: Home / Self Care | Payer: No Typology Code available for payment source | Source: Ambulatory Visit | Attending: Family Medicine | Admitting: Family Medicine

## 2020-05-06 DIAGNOSIS — Z1231 Encounter for screening mammogram for malignant neoplasm of breast: Secondary | ICD-10-CM

## 2020-05-27 ENCOUNTER — Encounter: Payer: Self-pay | Admitting: Nurse Practitioner

## 2020-05-27 ENCOUNTER — Other Ambulatory Visit: Payer: Self-pay

## 2020-05-27 ENCOUNTER — Ambulatory Visit: Payer: Self-pay | Admitting: Nurse Practitioner

## 2020-05-27 DIAGNOSIS — I10 Essential (primary) hypertension: Secondary | ICD-10-CM

## 2020-05-27 MED ORDER — OLMESARTAN MEDOXOMIL 20 MG PO TABS: 20.0000 mg | ORAL_TABLET | Freq: Every day | ORAL | 1 refills | Status: DC

## 2020-05-27 NOTE — Assessment & Plan Note (Addendum)
-  she does not check her BP at home, but it has been elevated when she has this checked at work -she has tried lisinopril in the past, but this caused a cough -she states she tried amlodipine in the past, but this caused a rash -Rx. Olmesartan -we discussed using tylenol for her headaches instead of ibuprofen as well as stopping smoking to decrease her BP

## 2020-05-27 NOTE — Progress Notes (Signed)
Acute Office Visit  Subjective:    Patient ID: Kathy Stewart, female    DOB: 31-Dec-1970, 50 y.o.   MRN: [id number]  Chief Complaint  Patient presents with  . Hypertension    Has felt like her bp has been elevated for the past 3 weeks. Has not had it checked until today at work.  . Headache    Ongoing x3 weeks. Has been taking IBU to help with the pain.    HPI Patient is in today for BP check.  Her headache has been ongoing for 3 weeks.  She states her pain is at 4/10, and she describes the pain as a tightness in her shoulder and neck. Her home BP readings were 173/94 and 168/91.  She takes atenolol in the evening.  She is taking ibuprofen for her headache as well, but that has not helped.  At her last OV (12/19/18), she was counseled to stop smoking. She is still smoking a half of a pack per day.  Past Medical History:  Diagnosis Date  . Allergy 1990   spring allergies  . Anemia   . Hypertension 01/2014  . Nicotine dependence 1997   current in 2016 approx 5 per day    Past Surgical History:  Procedure Laterality Date  . ABDOMINAL HYSTERECTOMY  11/2012   fibroids, has one ovary  . BILATERAL SALPINGECTOMY Bilateral 12/17/2012   Procedure: BILATERAL SALPINGECTOMY;  Surgeon: Jonnie Kind, MD;  Location: [facility];  Service: Gynecology;  Laterality: Bilateral;  . CESAREAN SECTION  1992  . SALPINGOOPHORECTOMY Left 12/17/2012   Procedure: SALPINGO OOPHORECTOMY;  Surgeon: Jonnie Kind, MD;  Location: [facility];  Service: Gynecology;  Laterality: Left;  . SUPRACERVICAL ABDOMINAL HYSTERECTOMY N/A 12/17/2012   Procedure: ABDOMINAL SUPRACERVICAL HYSTERECTOMY;  Surgeon: Jonnie Kind, MD;  Location: [facility];  Service: Gynecology;  Laterality: N/A;  . TONSILLECTOMY      Family History  Problem Relation Age of Onset  . Hypertension Mother   . Kidney disease Mother   . Cancer Father        bladder  . Hypertension Maternal Grandmother   . Diabetes Maternal Grandmother   .  Cataracts Maternal Grandmother   . Breast cancer Paternal Grandmother     Social History   Socioeconomic History  . Marital status: Single    Spouse name: Not on file  . Number of children: Not on file  . Years of education: Not on file  . Highest education level: Not on file  Occupational History  . Not on file  Tobacco Use  . Smoking status: Current Every Day Smoker    Packs/day: 0.25    Years: 17.00    Pack years: 4.25    Types: Cigarettes  . Smokeless tobacco: Never Used  Vaping Use  . Vaping Use: Never used  Substance and Sexual Activity  . Alcohol use: Yes    Comment: social  . Drug use: No  . Sexual activity: Never  Other Topics Concern  . Not on file  Social History Narrative  . Not on file   Social Determinants of Health   Financial Resource Strain: Not on file  Food Insecurity: Not on file  Transportation Needs: Not on file  Physical Activity: Not on file  Stress: Not on file  Social Connections: Not on file  Intimate Partner Violence: Not on file    Outpatient Medications Prior to Visit  Medication Sig Dispense Refill  . atenolol (TENORMIN) 50 MG tablet Take  1 tablet by mouth once daily 90 tablet 0  . ibuprofen (ADVIL) 800 MG tablet TAKE 1 TABLET BY MOUTH EVERY 8 HOURS AS NEEDED 30 tablet 0  . hydrOXYzine (ATARAX/VISTARIL) 25 MG tablet Take 1 tablet (25 mg total) by mouth every 6 (six) hours. (Patient not taking: Reported on 05/27/2020) 30 tablet 0   No facility-administered medications prior to visit.    Allergies  Allergen Reactions  . Oxycodone-Acetaminophen Itching    Review of Systems  Constitutional: Negative.   Respiratory: Negative.   Cardiovascular: Negative.   Neurological: Positive for headaches.       Objective:    Physical Exam Constitutional:      Appearance: She is well-developed.  Cardiovascular:     Heart sounds: Normal heart sounds.  Pulmonary:     Effort: Pulmonary effort is normal.  Neurological:     Mental  Status: She is alert and oriented to person, place, and time.     GCS: GCS eye subscore is 4. GCS verbal subscore is 5. GCS motor subscore is 6.     Cranial Nerves: No cranial nerve deficit, dysarthria or facial asymmetry.     BP (!) 192/105 (BP Location: Right Arm, Patient Position: Sitting, Cuff Size: Normal)   Pulse 78   Temp 99.2 F (37.3 C) (Oral)   Ht 5' 2.5" (1.588 m)   Wt 164 lb (74.4 kg)   LMP 10/24/2012   SpO2 98%   BMI 29.52 kg/m  Wt Readings from Last 3 Encounters:  05/27/20 164 lb (74.4 kg)  12/19/18 162 lb (73.5 kg)  12/15/17 159 lb (72.1 kg)    Health Maintenance Due  Topic Date Due  . Hepatitis C Screening  Never done  . COLONOSCOPY (Pts 45-51yrs Insurance coverage will need to be confirmed)  Never done    There are no preventive care reminders to display for this patient.   Lab Results  Component Value Date   TSH 0.38 (L) 12/23/2016   Lab Results  Component Value Date   WBC 7.5 11/27/2015   HGB 13.3 11/27/2015   HCT 39.6 11/27/2015   MCV 87.0 11/27/2015   PLT 164 11/27/2015   Lab Results  Component Value Date   NA 138 12/23/2016   K 4.1 12/23/2016   CO2 21 12/23/2016   GLUCOSE 90 12/23/2016   BUN 12 12/23/2016   CREATININE 0.72 12/23/2016   BILITOT 0.6 12/23/2016   ALKPHOS 60 12/23/2016   AST 12 12/23/2016   ALT 8 12/23/2016   PROT 6.6 12/23/2016   ALBUMIN 4.5 12/23/2016   CALCIUM 9.6 12/23/2016   ANIONGAP 14 03/25/2014   Lab Results  Component Value Date   CHOL 207 (H) 12/23/2016   Lab Results  Component Value Date   HDL 51 12/23/2016   Lab Results  Component Value Date   LDLCALC 140 (H) 12/23/2016   Lab Results  Component Value Date   TRIG 82 12/23/2016   Lab Results  Component Value Date   CHOLHDL 4.1 12/23/2016   Lab Results  Component Value Date   HGBA1C 5.6 12/23/2016       Assessment & Plan:   Problem List Items Addressed This Visit      Cardiovascular and Mediastinum   Essential hypertension    -she  does not check her BP at home, but it has been elevated when she has this checked at work -she has tried lisinopril in the past, but this caused a cough -she states she tried amlodipine in  the past, but this caused a rash -Rx. Olmesartan -we discussed using tylenol for her headaches instead of ibuprofen as well as stopping smoking to decrease her BP      Relevant Medications   olmesartan (BENICAR) 20 MG tablet       Meds ordered this encounter  Medications  . olmesartan (BENICAR) 20 MG tablet    Sig: Take 1 tablet (20 mg total) by mouth daily.    Dispense:  90 tablet    Refill:  1     Heather Roberts, NP

## 2020-05-27 NOTE — Patient Instructions (Addendum)
We started a new blood pressure medication. Please take this in addition to your atenolol.  We will meet back up in 2 months for a physical, but return to the clinic sooner if your BP is consistently above 140/90.

## 2020-05-28 ENCOUNTER — Encounter: Payer: Self-pay | Admitting: Nurse Practitioner

## 2020-06-18 NOTE — Progress Notes (Signed)
   Covid-19 Vaccination Clinic  Name:  VENISHA BOEHNING    MRN: [id number] DOB: [birth year]  06/18/2020  Ms. Bedgood was observed post Covid-19 immunization for 15 minutes without incident. She was provided with Vaccine Information Sheet and instruction to access the V-Safe system.   Ms. Calix was instructed to call 911 with any severe reactions post vaccine: Marland Kitchen Difficulty breathing  . Swelling of face and throat  . A fast heartbeat  . A bad rash all over body  . Dizziness and weakness   Immunizations Administered    Name Date Dose VIS Date Route   Moderna COVID-19 Vaccine 03/17/2020 -- -- --   Moderna Covid-19 Booster Vaccine 03/17/2020 10:40 AM 0.25 mL 02/18/2020 Intramuscular   Manufacturer: Moderna   Lot: [id number]   [place]: [phone]

## 2020-07-12 ENCOUNTER — Other Ambulatory Visit: Payer: Self-pay | Admitting: Family Medicine

## 2020-09-06 ENCOUNTER — Telehealth: Payer: Self-pay

## 2020-09-06 NOTE — Telephone Encounter (Signed)
Patient called left voicemail asking when was her last tdap shot. Call back # [phone].

## 2020-09-06 NOTE — Telephone Encounter (Signed)
error 

## 2020-09-07 NOTE — Telephone Encounter (Signed)
Left msg that last one was in 2016 and good for 10 years

## 2020-10-11 ENCOUNTER — Other Ambulatory Visit: Payer: Self-pay | Admitting: Family Medicine

## 2020-11-15 ENCOUNTER — Telehealth: Payer: Self-pay

## 2020-11-15 NOTE — Telephone Encounter (Signed)
Patient called and is ready to setup  Colonoscopy, she received the financial paperwork filled out and sent it in. Call back # [phone].

## 2020-11-17 ENCOUNTER — Other Ambulatory Visit: Payer: Self-pay

## 2020-11-17 DIAGNOSIS — Z1211 Encounter for screening for malignant neoplasm of colon: Secondary | ICD-10-CM

## 2020-11-17 NOTE — Telephone Encounter (Signed)
Referral entered. Called pt to make aware and no answer

## 2020-11-18 ENCOUNTER — Encounter: Payer: Self-pay | Admitting: Internal Medicine

## 2020-12-01 ENCOUNTER — Other Ambulatory Visit: Payer: Self-pay | Admitting: Nurse Practitioner

## 2020-12-01 DIAGNOSIS — I10 Essential (primary) hypertension: Secondary | ICD-10-CM

## 2021-01-10 ENCOUNTER — Other Ambulatory Visit: Payer: Self-pay | Admitting: Family Medicine

## 2021-01-19 ENCOUNTER — Other Ambulatory Visit: Payer: Self-pay

## 2021-01-19 ENCOUNTER — Ambulatory Visit (INDEPENDENT_AMBULATORY_CARE_PROVIDER_SITE_OTHER): Payer: Self-pay | Admitting: *Deleted

## 2021-01-19 VITALS — Ht 62.5 in | Wt 162.8 lb

## 2021-01-19 DIAGNOSIS — Z1211 Encounter for screening for malignant neoplasm of colon: Secondary | ICD-10-CM

## 2021-01-19 NOTE — Progress Notes (Signed)
Recommend [place] with propofol. ASA II.  Hold iron 7 days.

## 2021-01-19 NOTE — Progress Notes (Signed)
Pt requested Nov procedure.  Will be new pt.

## 2021-01-19 NOTE — Progress Notes (Addendum)
Gastroenterology Pre-Procedure Review  Request Date: 01/19/2021 Requesting Physician: Dr. Tula Nakayama, no previous TCS  PATIENT REVIEW QUESTIONS: The patient responded to the following health history questions as indicated:    1. Diabetes Melitis: no 2. Joint replacements in the past 12 months: no 3. Major health problems in the past 3 months: yes, PCP managing blood pressure 4. Has an artificial valve or MVP: no 5. Has a defibrillator: no 6. Has been advised in past to take antibiotics in advance of a procedure like teeth cleaning: no 7. Family history of colon cancer: no 8. Alcohol Use: yes, 6 pack of beer a week, 2 glasses of wine a month 9. Illicit drug Use: no 10. History of sleep apnea: no 11. History of coronary artery or other vascular stents placed within the last 12 months: no 12. History of any prior anesthesia complications: yes, feels like it is hard to breathe when she wakes up (c-section/partial hysterectomy) 13. Body mass index is 29.3 kg/m.    MEDICATIONS & ALLERGIES:    Patient reports the following regarding taking any blood thinners:   Plavix? no Aspirin? no Coumadin? no Brilinta? no Xarelto? no Eliquis? no Pradaxa? no Savaysa? no Effient? no  Patient confirms/reports the following medications:  Current Outpatient Medications  Medication Sig Dispense Refill   acetaminophen (TYLENOL) 500 MG tablet Take 500 mg by mouth as needed.     atenolol (TENORMIN) 50 MG tablet Take 1 tablet by mouth once daily 90 tablet 0   Cholecalciferol (VITAMIN D3) 50 MCG (2000 UT) TABS Take by mouth 2 (two) times a week.     ferrous sulfate 325 (65 FE) MG tablet Take 325 mg by mouth once a week. Takes one tablet weekly.     olmesartan (BENICAR) 20 MG tablet Take 1 tablet by mouth once daily (Patient taking differently: at bedtime.) 90 tablet 1   No current facility-administered medications for this visit.    Patient confirms/reports the following allergies:  Allergies   Allergen Reactions   Oxycodone-Acetaminophen Itching    No orders of the defined types were placed in this encounter.   AUTHORIZATION INFORMATION Primary Insurance: [place] Financial Assistance Pending  SCHEDULE INFORMATION: Procedure has been scheduled as follows:  Date: 03/18/2021 , Time: 8:30 Location: [facility] with Dr. Abbey Chatters  This Gastroenterology Pre-Precedure Review Form is being routed to the following provider(s): Neil Crouch, PA-C

## 2021-01-24 ENCOUNTER — Encounter: Payer: Self-pay | Admitting: Internal Medicine

## 2021-01-24 ENCOUNTER — Ambulatory Visit (INDEPENDENT_AMBULATORY_CARE_PROVIDER_SITE_OTHER): Payer: Self-pay | Admitting: Internal Medicine

## 2021-01-24 ENCOUNTER — Other Ambulatory Visit: Payer: Self-pay

## 2021-01-24 ENCOUNTER — Encounter: Payer: Self-pay | Admitting: Family Medicine

## 2021-01-24 VITALS — BP 155/89 | HR 64 | Temp 98.7°F | Resp 18 | Ht 63.0 in | Wt 162.1 lb

## 2021-01-24 DIAGNOSIS — M25561 Pain in right knee: Secondary | ICD-10-CM

## 2021-01-24 DIAGNOSIS — H6122 Impacted cerumen, left ear: Secondary | ICD-10-CM

## 2021-01-24 DIAGNOSIS — I10 Essential (primary) hypertension: Secondary | ICD-10-CM

## 2021-01-24 NOTE — Patient Instructions (Signed)
Please use Debrox ear drops for ear eax.
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
## 2021-02-07 ENCOUNTER — Encounter: Payer: Self-pay | Admitting: *Deleted

## 2021-02-07 NOTE — Progress Notes (Addendum)
Spoke to pt.  Scheduled procedure for 03/18/2021 at 8:30 with arrival at 7:00.  Pt informed to hold Iron 7 days prior to procedure.  Informed pt that I would mail out prep instructions and Iron medication adjustments.  Pt voiced understanding and confirmed address.  Pt aware that I have prep kit here saved for here since she doesn't have insurance.  She will come by closer to Nov to pick up kit.

## 2021-03-02 ENCOUNTER — Telehealth: Payer: Self-pay | Admitting: *Deleted

## 2021-03-02 ENCOUNTER — Telehealth: Payer: Self-pay | Admitting: Internal Medicine

## 2021-03-02 NOTE — Telephone Encounter (Signed)
Patient returned call, please call back  

## 2021-03-02 NOTE — Telephone Encounter (Signed)
Lmom for pt to call me back. 

## 2021-03-02 NOTE — Telephone Encounter (Signed)
She was Kathy Stewart triage pt. Sending to her

## 2021-03-02 NOTE — Telephone Encounter (Signed)
[phone] PATIENT CALLED TO CANCEL HER PROCEDURE AND SHE ALSO WANTS TO TALK TO Janace Hoard

## 2021-03-03 NOTE — Telephone Encounter (Signed)
Lmom for [facility] in [facility].

## 2021-03-03 NOTE — Telephone Encounter (Signed)
Spoke to pt.  She requested to cancel procedure.  She did not get approved for [employer].  She was informed by them that she could re-apply in 6 months.  She informed me that she may get insurance by then.  If so, she will call me back to reschedule procedure.

## 2021-03-09 ENCOUNTER — Telehealth: Payer: Self-pay | Admitting: Family Medicine

## 2021-03-09 NOTE — Telephone Encounter (Signed)
Pt caled in wanting Dr. Moshe Cipro to put in an order for Blood Work . Pt states it has been a while and needs to nhave her BW done. Same as usual fasting BW

## 2021-03-10 NOTE — Telephone Encounter (Signed)
What labs do you want her to have ?

## 2021-03-11 ENCOUNTER — Other Ambulatory Visit: Payer: Self-pay

## 2021-03-11 DIAGNOSIS — I10 Essential (primary) hypertension: Secondary | ICD-10-CM

## 2021-03-11 DIAGNOSIS — E785 Hyperlipidemia, unspecified: Secondary | ICD-10-CM

## 2021-03-11 NOTE — Telephone Encounter (Signed)
Labs ordered.

## 2021-03-18 ENCOUNTER — Ambulatory Visit (HOSPITAL_COMMUNITY): Admit: 2021-03-18 | Payer: Self-pay

## 2021-03-18 ENCOUNTER — Encounter (HOSPITAL_COMMUNITY): Payer: Self-pay

## 2021-03-18 SURGERY — COLONOSCOPY WITH PROPOFOL
Anesthesia: Monitor Anesthesia Care

## 2021-04-07 ENCOUNTER — Other Ambulatory Visit: Payer: Self-pay

## 2021-04-07 DIAGNOSIS — Z1231 Encounter for screening mammogram for malignant neoplasm of breast: Secondary | ICD-10-CM

## 2021-04-08 ENCOUNTER — Other Ambulatory Visit: Payer: Self-pay | Admitting: Family Medicine

## 2021-05-16 ENCOUNTER — Other Ambulatory Visit: Payer: Self-pay | Admitting: Nurse Practitioner

## 2021-05-27 ENCOUNTER — Ambulatory Visit: Payer: Self-pay | Admitting: Family Medicine

## 2021-05-31 ENCOUNTER — Ambulatory Visit: Payer: Self-pay

## 2021-05-31 ENCOUNTER — Ambulatory Visit
Admission: RE | Admit: 2021-05-31 | Discharge: 2021-05-31 | Disposition: A | Discharge status: Home / Self Care | Payer: No Typology Code available for payment source | Source: Ambulatory Visit | Attending: Obstetrics and Gynecology | Admitting: Obstetrics and Gynecology

## 2021-05-31 ENCOUNTER — Other Ambulatory Visit: Payer: Self-pay

## 2021-05-31 DIAGNOSIS — Z1231 Encounter for screening mammogram for malignant neoplasm of breast: Secondary | ICD-10-CM

## 2021-06-02 ENCOUNTER — Encounter: Payer: Self-pay | Admitting: Family Medicine

## 2021-06-02 ENCOUNTER — Ambulatory Visit (INDEPENDENT_AMBULATORY_CARE_PROVIDER_SITE_OTHER): Payer: 59 | Admitting: Family Medicine

## 2021-06-02 ENCOUNTER — Ambulatory Visit: Payer: Self-pay | Admitting: Family Medicine

## 2021-06-02 ENCOUNTER — Other Ambulatory Visit: Payer: Self-pay | Admitting: Obstetrics and Gynecology

## 2021-06-02 ENCOUNTER — Other Ambulatory Visit: Payer: Self-pay

## 2021-06-02 VITALS — BP 157/96 | HR 60 | Resp 16 | Ht 63.0 in | Wt 165.0 lb

## 2021-06-02 DIAGNOSIS — F17200 Nicotine dependence, unspecified, uncomplicated: Secondary | ICD-10-CM

## 2021-06-02 DIAGNOSIS — E663 Overweight: Secondary | ICD-10-CM

## 2021-06-02 DIAGNOSIS — M5090 Cervical disc disorder, unspecified, unspecified cervical region: Secondary | ICD-10-CM

## 2021-06-02 DIAGNOSIS — R928 Other abnormal and inconclusive findings on diagnostic imaging of breast: Secondary | ICD-10-CM

## 2021-06-02 DIAGNOSIS — Z1211 Encounter for screening for malignant neoplasm of colon: Secondary | ICD-10-CM | POA: Diagnosis not present

## 2021-06-02 DIAGNOSIS — E785 Hyperlipidemia, unspecified: Secondary | ICD-10-CM

## 2021-06-02 DIAGNOSIS — H6123 Impacted cerumen, bilateral: Secondary | ICD-10-CM | POA: Diagnosis not present

## 2021-06-02 DIAGNOSIS — Z1159 Encounter for screening for other viral diseases: Secondary | ICD-10-CM

## 2021-06-02 DIAGNOSIS — I10 Essential (primary) hypertension: Secondary | ICD-10-CM

## 2021-06-02 MED ORDER — OLMESARTAN MEDOXOMIL 20 MG PO TABS: 20.0000 mg | ORAL_TABLET | Freq: Every day | ORAL | 1 refills | Status: DC

## 2021-06-02 NOTE — Patient Instructions (Addendum)
Annual exam in 5 months, with pap, call if you need me sooner ° °Ear irrigation today °CBC, lipid,cmp and eGFR, as soon as possible and hep C screen. ° °Need to stop smoking, call 1800 QUITNOW for help ° °Need to reduce beer ° °Need to exercise and work on weight loss, increase vegetables ° °MRI of your neck is recommended due to tight feeling in neck with known disc disease, let me know when you decide to have this done as we discussed ° °You are referred for colonoscopy °Thanks for choosing [facility], we consider it a privelige to serve you. ° ° °

## 2021-06-06 ENCOUNTER — Encounter: Payer: Self-pay | Admitting: Family Medicine

## 2021-06-06 ENCOUNTER — Encounter: Payer: Self-pay | Admitting: Internal Medicine

## 2021-06-06 NOTE — Assessment & Plan Note (Signed)
Uncontrolled , out of medication for several days DASH diet and commitment to daily physical activity for a minimum of 30 minutes discussed and encouraged, as a part of hypertension management. The importance of attaining a healthy weight is also discussed.  BP/Weight 06/02/2021 01/24/2021 01/19/2021 05/27/2020 12/19/2018 10/26/3660 01/02/7653  Systolic BP 650 354 - 656 812 751 700  Diastolic BP 96 89 - 174 84 88 100  Wt. (Lbs) 165 162.12 162.8 164 162 159 157  BMI 29.23 28.72 29.3 29.52 29.16 28.17 27.81

## 2021-06-06 NOTE — Assessment & Plan Note (Signed)
Asked:confirms currently smokes cigarettes °Assess: Unwilling to set a quit date, but is cutting back °Advise: needs to QUIT to reduce risk of cancer, cardio and cerebrovascular disease °Assist: counseled for 5 minutes and literature provided °Arrange: follow up in 2 to 4 months ° °

## 2021-06-06 NOTE — Progress Notes (Signed)
[name]     MRN: [id number]      DOB: 12/02/1970   HPI Kathy Stewart is here for follow up and re-evaluation of chronic medical conditions, medication management and review of any available recent lab and radiology data.  Preventive health is updated, specifically  Cancer screening and Immunization.   .  C/o fullness in both ears  C/o tight feeling in neck with uppper arm tingling C/o weight gain, lack of exercise and wants to do better ROS Denies recent fever or chills. Denies sinus pressure, nasal congestion,  or sore throat. Denies chest congestion, productive cough or wheezing. Denies chest pains, palpitations and leg swelling Denies abdominal pain, nausea, vomiting,diarrhea or constipation.   Denies dysuria, frequency, hesitancy or incontinence. . Denies headaches, seizures, numbness, or tingling. Denies depression, anxiety or insomnia. Denies skin break down or rash.   PE  BP (!) 157/96    Pulse 60    Resp 16    Ht 5\' 3"  (1.6 m)    Wt 165 lb (74.8 kg)    LMP 10/24/2012    SpO2 98%    BMI 29.23 kg/m   Patient alert and oriented and in no cardiopulmonary distress.  HEENT: No facial asymmetry, EOMI,     Neck decreased ROM .bilateral cerumen impaction  Chest: Clear to auscultation bilaterally.  CVS: S1, S2 no murmurs, no S3.Regular rate.  ABD: Soft non tender.   Ext: No edema  MS: Adequate ROM spine, shoulders, hips and knees.  Skin: Intact, no ulcerations or rash noted.  Psych: Good eye contact, normal affect. Memory intact not anxious or depressed appearing.  CNS: CN 2-12 intact, power,  normal throughout.no focal deficits noted.   Assessment & Plan  Essential hypertension Uncontrolled , out of medication for several days DASH diet and commitment to daily physical activity for a minimum of 30 minutes discussed and encouraged, as a part of hypertension management. The importance of attaining a healthy weight is also discussed.  BP/Weight 06/02/2021  01/24/2021 01/19/2021 05/27/2020 12/19/2018 10/26/3149 11/03/1605  Systolic BP 371 062 - 694 854 627 035  Diastolic BP 96 89 - 009 84 88 100  Wt. (Lbs) 165 162.12 162.8 164 162 159 157  BMI 29.23 28.72 29.3 29.52 29.16 28.17 27.81       Bilateral impacted cerumen Bilateral outer ear irrigation , atraumatic  Cervical neck pain with evidence of disc disease Reports increased pain with upper extremity tingling, recommend updated MRI, holding off on this currrently, warning signs of worsening as in numbness and weakness discussed  NICOTINE ADDICTION Asked:confirms currently smokes cigarettes Assess: Unwilling to set a quit date, but is cutting back Advise: needs to QUIT to reduce risk of cancer, cardio and cerebrovascular disease Assist: counseled for 5 minutes and literature provided Arrange: follow up in 2 to 4 months   Overweight (BMI 25.0-29.9)  Patient re-educated about  the importance of commitment to a  minimum of 150 minutes of exercise per week as able.  The importance of healthy food choices with portion control discussed, as well as eating regularly and within a 12 hour window most days. The need to choose "clean , green" food 50 to 75% of the time is discussed, as well as to make water the primary drink and set a goal of 64 ounces water daily.    Weight /BMI 06/02/2021 01/24/2021 01/19/2021  WEIGHT 165 lb 162 lb 1.9 oz 162 lb 12.8 oz  HEIGHT 5\' 3"  5\' 3"  5' 2.5"  BMI 29.23 kg/m2 28.72 kg/m2 29.3 kg/m2

## 2021-06-06 NOTE — Assessment & Plan Note (Signed)
°  Patient re-educated about  the importance of commitment to a  minimum of 150 minutes of exercise per week as able.  The importance of healthy food choices with portion control discussed, as well as eating regularly and within a 12 hour window most days. The need to choose "clean , green" food 50 to 75% of the time is discussed, as well as to make water the primary drink and set a goal of 64 ounces water daily.    Weight /BMI 06/02/2021 01/24/2021 01/19/2021  WEIGHT 165 lb 162 lb 1.9 oz 162 lb 12.8 oz  HEIGHT 5\' 3"  5\' 3"  5' 2.5"  BMI 29.23 kg/m2 28.72 kg/m2 29.3 kg/m2

## 2021-06-06 NOTE — Assessment & Plan Note (Signed)
Reports increased pain with upper extremity tingling, recommend updated MRI, holding off on this currrently, warning signs of worsening as in numbness and weakness discussed

## 2021-06-06 NOTE — Assessment & Plan Note (Signed)
Bilateral outer ear irrigation , atraumatic

## 2021-06-21 ENCOUNTER — Telehealth: Payer: Self-pay | Admitting: Family Medicine

## 2021-06-21 NOTE — Telephone Encounter (Signed)
DMV forms  Noted Sleeved

## 2021-06-22 LAB — LIPID PANEL
Chol/HDL Ratio: 4.2 ratio (ref 0.0–4.4)
Cholesterol, Total: 229 mg/dL — ABNORMAL HIGH (ref 100–199)
HDL: 54 mg/dL (ref >39)
LDL Chol Calc (NIH): 150 mg/dL — ABNORMAL HIGH (ref 0–99)
Triglycerides: 138 mg/dL (ref 0–149)
VLDL Cholesterol Cal: 25 mg/dL (ref 5–40)

## 2021-06-22 LAB — CBC
Hematocrit: 42 % (ref 34.0–46.6)
Hemoglobin: 13.8 g/dL (ref 11.1–15.9)
MCH: 28.6 pg (ref 26.6–33.0)
MCHC: 32.9 g/dL (ref 31.5–35.7)
MCV: 87 fL (ref 79–97)
Platelets: 157 10*3/uL (ref 150–450)
RBC: 4.83 x10E6/uL (ref 3.77–5.28)
RDW: 12.3 % (ref 11.7–15.4)
WBC: 7 10*3/uL (ref 3.4–10.8)

## 2021-06-22 LAB — CMP14+EGFR
ALT: 12 IU/L (ref 0–32)
AST: 14 IU/L (ref 0–40)
Albumin/Globulin Ratio: 2.4 — ABNORMAL HIGH (ref 1.2–2.2)
Albumin: 4.8 g/dL (ref 3.8–4.9)
Alkaline Phosphatase: 80 IU/L (ref 44–121)
BUN/Creatinine Ratio: 19 (ref 9–23)
BUN: 13 mg/dL (ref 6–24)
Bilirubin Total: 0.6 mg/dL (ref 0.0–1.2)
CO2: 22 mmol/L (ref 20–29)
Calcium: 9.9 mg/dL (ref 8.7–10.2)
Chloride: 102 mmol/L (ref 96–106)
Creatinine, Ser: 0.69 mg/dL (ref 0.57–1.00)
Globulin, Total: 2 g/dL (ref 1.5–4.5)
Glucose: 107 mg/dL — ABNORMAL HIGH (ref 70–99)
Potassium: 4.1 mmol/L (ref 3.5–5.2)
Sodium: 139 mmol/L (ref 134–144)
Total Protein: 6.8 g/dL (ref 6.0–8.5)
eGFR: 104 mL/min/{1.73_m2} (ref >59)

## 2021-06-22 LAB — HEPATITIS C ANTIBODY: Hep C Virus Ab: NONREACTIVE

## 2021-06-24 ENCOUNTER — Other Ambulatory Visit: Payer: Self-pay | Admitting: Family Medicine

## 2021-06-24 ENCOUNTER — Ambulatory Visit
Admission: RE | Admit: 2021-06-24 | Discharge: 2021-06-24 | Disposition: A | Discharge status: Home / Self Care | Payer: No Typology Code available for payment source | Source: Ambulatory Visit | Attending: Obstetrics and Gynecology | Admitting: Obstetrics and Gynecology

## 2021-06-24 ENCOUNTER — Ambulatory Visit
Admission: RE | Admit: 2021-06-24 | Discharge: 2021-06-24 | Disposition: A | Discharge status: Home / Self Care | Payer: 59 | Source: Ambulatory Visit | Attending: Obstetrics and Gynecology | Admitting: Obstetrics and Gynecology

## 2021-06-24 DIAGNOSIS — R928 Other abnormal and inconclusive findings on diagnostic imaging of breast: Secondary | ICD-10-CM

## 2021-06-24 NOTE — Telephone Encounter (Signed)
Patient return called and let her know that does not qualify for placard.  The patient asked to have Dr Moshe Cipro give her a call, she has bulging disc.  [phone].

## 2021-06-24 NOTE — Telephone Encounter (Signed)
Does not qualify. Left voice mail for patient to call our office.

## 2021-06-27 NOTE — Telephone Encounter (Signed)
Patient wanted to speak with you regarding her not qualifying for placard

## 2021-06-28 ENCOUNTER — Telehealth: Payer: Self-pay

## 2021-06-28 NOTE — Telephone Encounter (Signed)
Patient called asked for nurse to please give her a call back [phone] about her last visit.

## 2021-06-29 ENCOUNTER — Encounter: Payer: Self-pay | Admitting: Family Medicine

## 2021-06-30 ENCOUNTER — Ambulatory Visit (INDEPENDENT_AMBULATORY_CARE_PROVIDER_SITE_OTHER): Payer: 59 | Admitting: Family Medicine

## 2021-06-30 ENCOUNTER — Encounter: Payer: Self-pay | Admitting: Family Medicine

## 2021-06-30 ENCOUNTER — Other Ambulatory Visit: Payer: Self-pay

## 2021-06-30 VITALS — BP 146/90 | HR 74 | Resp 15 | Ht 63.0 in | Wt 165.0 lb

## 2021-06-30 DIAGNOSIS — L040 Acute lymphadenitis of face, head and neck: Secondary | ICD-10-CM | POA: Diagnosis not present

## 2021-06-30 DIAGNOSIS — I10 Essential (primary) hypertension: Secondary | ICD-10-CM | POA: Diagnosis not present

## 2021-06-30 MED ORDER — OLMESARTAN MEDOXOMIL 40 MG PO TABS
ORAL_TABLET | ORAL | 1 refills | Status: DC
Start: 2021-06-30 — End: 2021-11-16

## 2021-06-30 MED ORDER — IBUPROFEN 800 MG PO TABS: 800.0000 mg | ORAL_TABLET | Freq: Three times a day (TID) | ORAL | 0 refills | Status: DC | PRN

## 2021-06-30 MED ORDER — CEPHALEXIN 500 MG PO CAPS: 500.0000 mg | ORAL_CAPSULE | Freq: Three times a day (TID) | ORAL | 0 refills | Status: DC

## 2021-06-30 NOTE — Assessment & Plan Note (Signed)
Uncontrolled, incrase olmesartan to 40 mg daily, ?DASH diet and commitment to daily physical activity for a minimum of 30 minutes discussed and encouraged, as a part of hypertension management. ?The importance of attaining a healthy weight is also discussed. ? ?BP/Weight 06/30/2021 06/02/2021 01/24/2021 01/19/2021 05/27/2020 12/19/2018 12/15/2017  ?Systolic BP 539 672 897 - 192 124 144  ?Diastolic BP 90 96 89 - 915 84 88  ?Wt. (Lbs) 165 165 162.12 162.8 164 162 159  ?BMI 29.23 29.23 28.72 29.3 29.52 29.16 28.17  ? ? ? ? ?

## 2021-06-30 NOTE — Telephone Encounter (Signed)
Has in office appt today will discuss handicapped sticker refusal at that time  ?

## 2021-06-30 NOTE — Patient Instructions (Addendum)
F/u in 8 to 10 weeks, re evaluate blood pressure ? ?New higher dose of olmesartan 40 mg one daily, since your blood pressure is too high, continue atenolol one daily as before ?( OK to take two 20 mg tablets together every day until you fill the 40 mg tab if you do not fill it today ? ?1 week course of keflex is prescribed for inflamed left neck lymph glad , also ibuprofen prescribed, may take one  daily for next 3 days to help with pain and swelling of lymph node. ? ?Ear is not infected ? ?Thanks for choosing [facility], we consider it a privelige to serve you. ? ?

## 2021-07-13 ENCOUNTER — Encounter: Payer: Self-pay | Admitting: Family Medicine

## 2021-07-13 NOTE — Assessment & Plan Note (Signed)
Keflex x 1 week ?

## 2021-07-13 NOTE — Progress Notes (Signed)
? ?  Kathy Stewart     MRN: [id number]      DOB: August 11, 1970 ? ? ?HPI ?Kathy Stewart is here with 3 day h/o left ear  pain and a 2 day h/o tender swollen gland on left neck. No fever, drainage from ear or loss of hearing ? ?ROS ?Denies recent fever or chills. ?Denies sinus pressure, nasal congestion,  or sore throat. ?Denies chest congestion, productive cough or wheezing. ?Denies chest pains, palpitations and leg swelling ?ia. ?Denies skin break down or rash. ? ? ?PE ? ?BP (!) 146/90   Pulse 74   Resp 15   Ht '5\' 3"'$  (1.6 m)   Wt 165 lb (74.8 kg)   LMP 10/24/2012   SpO2 96%   BMI 29.23 kg/m?  ? ?Patient alert and oriented and in no cardiopulmonary distress. ? ?HEENT: No facial asymmetry, EOMI,     Neck supple .Left TM clearwih good light reflex.Tender anterior left cervical adenitis ? ?Chest: Clear to auscultation bilaterally. ? ?CVS: S1, S2 no murmurs, no S3.Regular rate. ? ?ABD: Soft non tender.  ? ?Ext: No edema ? ?MS: Adequate ROM spine, shoulders, hips and knees. ? ?Skin: Intact, no ulcerations or rash noted. ? ?Psych: Good eye contact, normal affect. Memory intact not anxious or depressed appearing. ? ?CNS: CN 2-12 intact, power,  normal throughout.no focal deficits noted. ? ? ?Assessment & Plan ? ?Essential hypertension ?Uncontrolled, incrase olmesartan to 40 mg daily, ?DASH diet and commitment to daily physical activity for a minimum of 30 minutes discussed and encouraged, as a part of hypertension management. ?The importance of attaining a healthy weight is also discussed. ? ?BP/Weight 06/30/2021 06/02/2021 01/24/2021 01/19/2021 05/27/2020 12/19/2018 12/15/2017  ?Systolic BP 527 782 423 - 192 124 144  ?Diastolic BP 90 96 89 - 536 84 88  ?Wt. (Lbs) 165 165 162.12 162.8 164 162 159  ?BMI 29.23 29.23 28.72 29.3 29.52 29.16 28.17  ? ? ? ? ? ?Acute cervical adenitis ?Keflex x 1 week ? ?

## 2021-08-03 ENCOUNTER — Ambulatory Visit: Payer: 59

## 2021-08-25 ENCOUNTER — Ambulatory Visit: Payer: 59 | Admitting: Family Medicine

## 2021-09-02 ENCOUNTER — Telehealth: Payer: Self-pay

## 2021-09-02 NOTE — Telephone Encounter (Signed)
Patient called asking about blood work in the past that was gone over with patient, patient asked for nurse to return her call today [phone]. reference to patient protein.  ? ?

## 2021-09-07 NOTE — Telephone Encounter (Signed)
Albumin/Globulin Ratio- was elevated at her last labs and wants to make sure it doesn't have anything to do with the protein shakes she's drinking or if she is drinking too many and needs to cut back. Please advise ?

## 2021-09-07 NOTE — Telephone Encounter (Signed)
Pt aware.

## 2021-09-16 ENCOUNTER — Ambulatory Visit: Payer: 59 | Admitting: Family Medicine

## 2021-09-22 ENCOUNTER — Ambulatory Visit: Payer: 59 | Admitting: Family Medicine

## 2021-10-07 ENCOUNTER — Other Ambulatory Visit: Payer: Self-pay | Admitting: Nurse Practitioner

## 2021-10-23 ENCOUNTER — Encounter: Payer: Self-pay | Admitting: *Deleted

## 2021-10-23 ENCOUNTER — Other Ambulatory Visit: Payer: Self-pay

## 2021-10-23 ENCOUNTER — Ambulatory Visit
Admission: EM | Admit: 2021-10-23 | Discharge: 2021-10-23 | Disposition: A | Discharge status: Home / Self Care | Payer: 59 | Attending: Nurse Practitioner | Admitting: Nurse Practitioner

## 2021-10-23 DIAGNOSIS — L040 Acute lymphadenitis of face, head and neck: Secondary | ICD-10-CM

## 2021-10-23 DIAGNOSIS — H6123 Impacted cerumen, bilateral: Secondary | ICD-10-CM

## 2021-10-23 MED ORDER — CEPHALEXIN 500 MG PO CAPS: 500.0000 mg | ORAL_CAPSULE | Freq: Three times a day (TID) | ORAL | 0 refills | Status: AC

## 2021-11-11 ENCOUNTER — Encounter: Payer: 59 | Admitting: Family Medicine

## 2021-11-16 ENCOUNTER — Encounter: Payer: Self-pay | Admitting: Family Medicine

## 2021-11-16 ENCOUNTER — Other Ambulatory Visit (HOSPITAL_COMMUNITY)
Admission: RE | Admit: 2021-11-16 | Discharge: 2021-11-16 | Disposition: A | Discharge status: Home / Self Care | Payer: 59 | Source: Ambulatory Visit | Attending: Family Medicine | Admitting: Family Medicine

## 2021-11-16 ENCOUNTER — Ambulatory Visit (INDEPENDENT_AMBULATORY_CARE_PROVIDER_SITE_OTHER): Payer: 59 | Admitting: Family Medicine

## 2021-11-16 VITALS — BP 158/90 | HR 74 | Ht 62.0 in | Wt 165.1 lb

## 2021-11-16 DIAGNOSIS — I10 Essential (primary) hypertension: Secondary | ICD-10-CM

## 2021-11-16 DIAGNOSIS — L989 Disorder of the skin and subcutaneous tissue, unspecified: Secondary | ICD-10-CM

## 2021-11-16 DIAGNOSIS — E785 Hyperlipidemia, unspecified: Secondary | ICD-10-CM | POA: Diagnosis not present

## 2021-11-16 DIAGNOSIS — F17200 Nicotine dependence, unspecified, uncomplicated: Secondary | ICD-10-CM | POA: Diagnosis not present

## 2021-11-16 DIAGNOSIS — R002 Palpitations: Secondary | ICD-10-CM | POA: Diagnosis not present

## 2021-11-16 DIAGNOSIS — Z0001 Encounter for general adult medical examination with abnormal findings: Secondary | ICD-10-CM | POA: Diagnosis not present

## 2021-11-16 DIAGNOSIS — Z124 Encounter for screening for malignant neoplasm of cervix: Secondary | ICD-10-CM | POA: Insufficient documentation

## 2021-11-16 MED ORDER — OLMESARTAN MEDOXOMIL-HCTZ 40-25 MG PO TABS
1.0000 | ORAL_TABLET | Freq: Every day | ORAL | 3 refills | Status: DC
Start: 2021-11-16 — End: 2022-02-20

## 2021-11-16 NOTE — Assessment & Plan Note (Signed)
Hyperpigmented and on right index x 1 year, inc in size, refer Derm

## 2021-11-16 NOTE — Assessment & Plan Note (Signed)
Asked:confirms currently smokes cigarettes °Assess: Unwilling to set a quit date, but is cutting back °Advise: needs to QUIT to reduce risk of cancer, cardio and cerebrovascular disease °Assist: counseled for 5 minutes and literature provided °Arrange: follow up in 2 to 4 months ° °

## 2021-11-16 NOTE — Assessment & Plan Note (Signed)
Hyperlipidemia:Low fat diet discussed and encouraged.   Lipid Panel  Lab Results  Component Value Date   CHOL 229 (H) 06/21/2021   HDL 54 06/21/2021   LDLCALC 150 (H) 06/21/2021   TRIG 138 06/21/2021   CHOLHDL 4.2 06/21/2021     Needs to lower fat intake

## 2021-11-16 NOTE — Assessment & Plan Note (Signed)
3 day episode of intermittent palpitations, several months ago, update EKG and reduce caffeine and nicotine

## 2021-11-16 NOTE — Assessment & Plan Note (Signed)
Uncontrolled, stop benicar 40 mg , new is benicar/hctz 40/25 one daily DASH diet and commitment to daily physical activity for a minimum of 30 minutes discussed and encouraged, as a part of hypertension management. The importance of attaining a healthy weight is also discussed.     11/16/2021    9:01 AM 11/16/2021    8:24 AM 10/23/2021    9:11 AM 10/23/2021    8:34 AM 06/30/2021    3:02 PM 06/02/2021    1:35 PM 01/24/2021    1:42 PM  BP/Weight  Systolic BP 916 945 038 882 800 349 179  Diastolic BP 90 84 81 89 90 96 89  Wt. (Lbs)  165.08   165 165 162.12  BMI  30.19 kg/m2   29.23 kg/m2 29.23 kg/m2 28.72 kg/m2

## 2021-11-16 NOTE — Patient Instructions (Addendum)
Follow-up in 6 to 8-week to reevaluate blood pressure, call if you need me sooner.  New medication started for blood pressure is Benicar/HCTZ 40/25 1 daily.  Do not fill Benicar 40 mg daily this medication has been discontinued.  Continue Tenormin 50 mg as before.  Nurse please provide handouts on hypertension to patient today  Nurse pls retrieve shingrix vaccine documentation from [place] and enter  EKG today because of recent history of palpitations.  Please monitor nicotine and caffeine and alcohol use as all of these can contribute to palpitations.  If you have recurrence of the symptoms you need to contact the office urgently so that you can be referred to cardiology for monitoring.  You are referred to dermatology regarding wart on right index finger.  Please change eating and drinking habits so that you do lose weight.  Please try to get 1 additional hour of sleep by going to bed by 10 PM as adequate sleep is very important for him.  Please try to commit to 30 minutes of exercise at least 5 days a week.  It is very good that you have started class which you enjoy also. Please reduce fried and fatty foods Please continue to woprk on getting colonoscopy for colon cancer screening

## 2021-11-16 NOTE — Assessment & Plan Note (Signed)

## 2021-11-18 LAB — CYTOLOGY - PAP
Comment: NEGATIVE
Diagnosis: NEGATIVE
Diagnosis: REACTIVE
High risk HPV: NEGATIVE

## 2021-11-20 ENCOUNTER — Encounter: Payer: Self-pay | Admitting: Family Medicine

## 2021-11-20 NOTE — Progress Notes (Signed)
Kathy Stewart     MRN: [id number]      DOB: 1970/07/01  HPI: Patient is in for annual physical exam.c/o wart on right index finger increasing in size over past 1 year wants it removed C/o intermittent palpitations. Immunization is reviewed , and  updated if needed.   PE: BP (!) 158/90   Pulse 74   Ht '5\' 2"'$  (1.575 m)   Wt 165 lb 1.3 oz (74.9 kg)   LMP 10/17/2012   SpO2 97%   BMI 30.19 kg/m   Pleasant  female, alert and oriented x 3, in no cardio-pulmonary distress. Afebrile. HEENT No facial trauma or asymetry. Sinuses non tender.  Extra occullar muscles intact.. External ears normal, . Neck: supple, no adenopathy,JVD or thyromegaly.No bruits.  Chest: Clear to ascultation bilaterally.No crackles or wheezes. Non tender to palpation  Breast: No asymetry,no masses or lumps. No tenderness. No nipple discharge or inversion. No axillary or supraclavicular adenopathy  Cardiovascular system; Heart sounds normal,  S1 and  S2 ,no S3.  No murmur, or thrill. Apical beat not displaced Peripheral pulses normal.  Abdomen: Soft, non tender, no organomegaly or masses. No bruits. Bowel sounds normal. No guarding, tenderness or rebound.   GU: External genitalia normal female genitalia , normal female distribution of hair. No lesions. Urethral meatus normal in size, no  Prolapse, no lesions visibly  Present. Bladder non tender. Vagina pink and moist , with no visible lesions , discharge present . Adequate pelvic support no  cystocele or rectocele noted Cervix pink and appears healthy, no lesions or ulcerations noted, no discharge noted from os Uterus normal size, no adnexal masses, no cervical motion or adnexal tenderness.   Musculoskeletal exam: Full ROM of spine, hips , shoulders and knees. No deformity ,swelling or crepitus noted. No muscle wasting or atrophy.   Neurologic: Cranial nerves 2 to 12 intact. Power, tone ,sensation and reflexes normal throughout. No  disturbance in gait. No tremor.  Skin: Intact, no ulceration, erythema , scaling or rash noted.hyperpigmented skin lesion on right index/ wart  Psych; Normal mood and affect. Judgement and concentration normal   Assessment & Plan:  Annual visit for general adult medical examination with abnormal findings Annual exam as documented. Counseling done  re healthy lifestyle involving commitment to 150 minutes exercise per week, heart healthy diet, and attaining healthy weight.The importance of adequate sleep also discussed. Regular seat belt use and home safety, is also discussed. Changes in health habits are decided on by the patient with goals and time frames  set for achieving them. Immunization and cancer screening needs are specifically addressed at this visit.   Essential hypertension Uncontrolled, stop benicar 40 mg , new is benicar/hctz 40/25 one daily DASH diet and commitment to daily physical activity for a minimum of 30 minutes discussed and encouraged, as a part of hypertension management. The importance of attaining a healthy weight is also discussed.     11/16/2021    9:01 AM 11/16/2021    8:24 AM 10/23/2021    9:11 AM 10/23/2021    8:34 AM 06/30/2021    3:02 PM 06/02/2021    1:35 PM 01/24/2021    1:42 PM  BP/Weight  Systolic BP 408 144 818 563 149 702 637  Diastolic BP 90 84 81 89 90 96 89  Wt. (Lbs)  165.08   165 165 162.12  BMI  30.19 kg/m2   29.23 kg/m2 29.23 kg/m2 28.72 kg/m2       Hyperlipidemia LDL  goal <130 Hyperlipidemia:Low fat diet discussed and encouraged.   Lipid Panel  Lab Results  Component Value Date   CHOL 229 (H) 06/21/2021   HDL 54 06/21/2021   LDLCALC 150 (H) 06/21/2021   TRIG 138 06/21/2021   CHOLHDL 4.2 06/21/2021     Needs to lower fat intake  Palpitations 3 day episode of intermittent palpitations, several months ago, update EKG and reduce caffeine and nicotine  NICOTINE ADDICTION Asked:confirms currently smokes cigarettes Assess:  Unwilling to set a quit date, but is cutting back Advise: needs to QUIT to reduce risk of cancer, cardio and cerebrovascular disease Assist: counseled for 5 minutes and literature provided Arrange: follow up in 2 to 4 months   Skin lesion Hyperpigmented and on right index x 1 year, inc in size, refer Derm

## 2021-12-26 ENCOUNTER — Telehealth: Payer: Self-pay | Admitting: Family Medicine

## 2021-12-26 NOTE — Telephone Encounter (Signed)
Pt called stating that she is wanting the dermatology referral resent to [facility]. Dr. Tarri Glenn is no longer taking new patient. Can you please resend to [facility]?

## 2021-12-27 ENCOUNTER — Other Ambulatory Visit: Payer: Self-pay | Admitting: Family Medicine

## 2021-12-27 DIAGNOSIS — L989 Disorder of the skin and subcutaneous tissue, unspecified: Secondary | ICD-10-CM

## 2021-12-27 NOTE — Telephone Encounter (Signed)
Referral sent 

## 2021-12-30 ENCOUNTER — Encounter: Payer: Self-pay | Admitting: Family Medicine

## 2021-12-30 ENCOUNTER — Ambulatory Visit (INDEPENDENT_AMBULATORY_CARE_PROVIDER_SITE_OTHER): Payer: 59 | Admitting: Family Medicine

## 2021-12-30 VITALS — BP 110/70 | HR 71 | Ht 62.0 in | Wt 163.1 lb

## 2021-12-30 DIAGNOSIS — E559 Vitamin D deficiency, unspecified: Secondary | ICD-10-CM

## 2021-12-30 DIAGNOSIS — E663 Overweight: Secondary | ICD-10-CM

## 2021-12-30 DIAGNOSIS — E785 Hyperlipidemia, unspecified: Secondary | ICD-10-CM

## 2021-12-30 DIAGNOSIS — F17200 Nicotine dependence, unspecified, uncomplicated: Secondary | ICD-10-CM | POA: Diagnosis not present

## 2021-12-30 DIAGNOSIS — Z1231 Encounter for screening mammogram for malignant neoplasm of breast: Secondary | ICD-10-CM

## 2021-12-30 DIAGNOSIS — I10 Essential (primary) hypertension: Secondary | ICD-10-CM

## 2021-12-30 NOTE — Patient Instructions (Addendum)
F/U in 6 months, call if you need me sooner  Please get flu vaccine  Please follow through on colonoscopy, need this  Schedule your mammogram for Feb 1 or shortly after  EXCELLENT BP , no med changes  It is important that you exercise regularly at least 30 minutes 5 times a week. If you develop chest pain, have severe difficulty breathing, or feel very tired, stop exercising immediately and seek medical attention   Weight loss will lower blood pressure and possibly allow you  to lower med dose  Smoking about 10 /day, work intentionally on cutting back and call [phone]  Fasting CBC, lipid, cmp and eGFr, TSH an vit D 3 to 5 days before visit  Thanks for choosing [facility], we consider it a privelige to serve you.

## 2022-01-02 ENCOUNTER — Encounter: Payer: Self-pay | Admitting: Family Medicine

## 2022-01-02 NOTE — Assessment & Plan Note (Signed)
Controlled, no change in medication DASH diet and commitment to daily physical activity for a minimum of 30 minutes discussed and encouraged, as a part of hypertension management. The importance of attaining a healthy weight is also discussed.     12/30/2021    3:52 PM 12/30/2021    3:41 PM 11/16/2021    9:01 AM 11/16/2021    8:24 AM 10/23/2021    9:11 AM 10/23/2021    8:34 AM 06/30/2021    3:02 PM  BP/Weight  Systolic BP 400 867 619 509 326 712 458  Diastolic BP 70 69 90 84 81 89 90  Wt. (Lbs)  163.12  165.08   165  BMI  29.84 kg/m2  30.19 kg/m2   29.23 kg/m2

## 2022-01-02 NOTE — Assessment & Plan Note (Signed)
Hyperlipidemia:Low fat diet discussed and encouraged.   Lipid Panel  Lab Results  Component Value Date   CHOL 229 (H) 06/21/2021   HDL 54 06/21/2021   LDLCALC 150 (H) 06/21/2021   TRIG 138 06/21/2021   CHOLHDL 4.2 06/21/2021     Updated lab needed at/ before next visit.

## 2022-01-02 NOTE — Assessment & Plan Note (Signed)
Asked:confirms currently smokes cigarettes 10/day Assess: Unwilling to set a quit date, but is cutting back Advise: needs to QUIT to reduce risk of cancer, cardio and cerebrovascular disease Assist: counseled for 5 minutes and literature provided Arrange: follow up in 2 to 4 months  

## 2022-01-02 NOTE — Progress Notes (Signed)
JEANE CASHATT     MRN: [id number]      DOB: 06-May-1970   HPI Ms. Rena is here for follow up and re-evaluation of chronic medical conditions, medication management and review of any available recent lab and radiology data.  Preventive health is updated, specifically  Cancer screening and Immunization.   Questions or concerns regarding consultations or procedures which the PT has had in the interim are  addressed. The PT denies any adverse reactions to current medications since the last visit.  There are no new concerns.  There are no specific complaints   ROS Denies recent fever or chills. Denies sinus pressure, nasal congestion, ear pain or sore throat. Denies chest congestion, productive cough or wheezing. Denies chest pains, palpitations and leg swelling Denies abdominal pain, nausea, vomiting,diarrhea or constipation.   Denies dysuria, frequency, hesitancy or incontinence. Denies joint pain, swelling and limitation in mobility. Denies headaches, seizures, numbness, or tingling. Denies depression, anxiety or insomnia. Denies skin break down or rash.   PE  BP 110/70   Pulse 71   Ht '5\' 2"'$  (1.575 m)   Wt 163 lb 1.9 oz (74 kg)   LMP 10/17/2012   SpO2 97%   BMI 29.84 kg/m   Patient alert and oriented and in no cardiopulmonary distress.  HEENT: No facial asymmetry, EOMI,     Neck supple .  Chest: Clear to auscultation bilaterally.  CVS: S1, S2 no murmurs, no S3.Regular rate.  ABD: Soft non tender.   Ext: No edema  MS: Adequate ROM spine, shoulders, hips and knees.  Skin: Intact, no ulcerations or rash noted.  Psych: Good eye contact, normal affect. Memory intact not anxious or depressed appearing.  CNS: CN 2-12 intact, power,  normal throughout.no focal deficits noted.   Assessment & Plan  Essential hypertension Controlled, no change in medication DASH diet and commitment to daily physical activity for a minimum of 30 minutes discussed and encouraged,  as a part of hypertension management. The importance of attaining a healthy weight is also discussed.     12/30/2021    3:52 PM 12/30/2021    3:41 PM 11/16/2021    9:01 AM 11/16/2021    8:24 AM 10/23/2021    9:11 AM 10/23/2021    8:34 AM 06/30/2021    3:02 PM  BP/Weight  Systolic BP 235 361 443 154 008 676 195  Diastolic BP 70 69 90 84 81 89 90  Wt. (Lbs)  163.12  165.08   165  BMI  29.84 kg/m2  30.19 kg/m2   29.23 kg/m2       NICOTINE ADDICTION Asked:confirms currently smokes cigarettes 10/day Assess: Unwilling to set a quit date, but is cutting back Advise: needs to QUIT to reduce risk of cancer, cardio and cerebrovascular disease Assist: counseled for 5 minutes and literature provided Arrange: follow up in 2 to 4 months   Overweight (BMI 25.0-29.9)  Patient re-educated about  the importance of commitment to a  minimum of 150 minutes of exercise per week as able.  The importance of healthy food choices with portion control discussed, as well as eating regularly and within a 12 hour window most days. The need to choose "clean , green" food 50 to 75% of the time is discussed, as well as to make water the primary drink and set a goal of 64 ounces water daily.       12/30/2021    3:41 PM 11/16/2021    8:24 AM 06/30/2021  3:02 PM  Weight /BMI  Weight 163 lb 1.9 oz 165 lb 1.3 oz 165 lb  Height '5\' 2"'$  (1.575 m) '5\' 2"'$  (1.575 m) '5\' 3"'$  (1.6 m)  BMI 29.84 kg/m2 30.19 kg/m2 29.23 kg/m2      Hyperlipidemia LDL goal <130 Hyperlipidemia:Low fat diet discussed and encouraged.   Lipid Panel  Lab Results  Component Value Date   CHOL 229 (H) 06/21/2021   HDL 54 06/21/2021   LDLCALC 150 (H) 06/21/2021   TRIG 138 06/21/2021   CHOLHDL 4.2 06/21/2021     Updated lab needed at/ before next visit.

## 2022-01-02 NOTE — Assessment & Plan Note (Signed)
  Patient re-educated about  the importance of commitment to a  minimum of 150 minutes of exercise per week as able.  The importance of healthy food choices with portion control discussed, as well as eating regularly and within a 12 hour window most days. The need to choose "clean , green" food 50 to 75% of the time is discussed, as well as to make water the primary drink and set a goal of 64 ounces water daily.       12/30/2021    3:41 PM 11/16/2021    8:24 AM 06/30/2021    3:02 PM  Weight /BMI  Weight 163 lb 1.9 oz 165 lb 1.3 oz 165 lb  Height '5\' 2"'$  (1.575 m) '5\' 2"'$  (1.575 m) '5\' 3"'$  (1.6 m)  BMI 29.84 kg/m2 30.19 kg/m2 29.23 kg/m2

## 2022-01-03 ENCOUNTER — Other Ambulatory Visit: Payer: Self-pay | Admitting: Family Medicine

## 2022-01-16 ENCOUNTER — Telehealth: Payer: Self-pay | Admitting: Family Medicine

## 2022-01-16 NOTE — Telephone Encounter (Signed)
Pt called stating she is wanting to know if we received the records of her 2nd shingrix ? Please call patient

## 2022-01-16 NOTE — Telephone Encounter (Signed)
Yes, we have it documented

## 2022-01-17 DIAGNOSIS — L538 Other specified erythematous conditions: Secondary | ICD-10-CM | POA: Diagnosis not present

## 2022-01-17 DIAGNOSIS — B078 Other viral warts: Secondary | ICD-10-CM | POA: Diagnosis not present

## 2022-01-17 DIAGNOSIS — Z789 Other specified health status: Secondary | ICD-10-CM | POA: Diagnosis not present

## 2022-01-17 DIAGNOSIS — R58 Hemorrhage, not elsewhere classified: Secondary | ICD-10-CM | POA: Diagnosis not present

## 2022-01-17 NOTE — Telephone Encounter (Signed)
Pt aware we have the complete record

## 2022-02-16 ENCOUNTER — Other Ambulatory Visit: Payer: Self-pay

## 2022-02-16 ENCOUNTER — Telehealth: Payer: Self-pay | Admitting: Family Medicine

## 2022-02-16 DIAGNOSIS — Z1211 Encounter for screening for malignant neoplasm of colon: Secondary | ICD-10-CM

## 2022-02-16 NOTE — Telephone Encounter (Signed)
Pt called stating she is needing a new referral sent to [facility] for a colonoscopy. The one that was send has expired. Since she has new insu they are requesting referral. Can you please send this?

## 2022-02-16 NOTE — Telephone Encounter (Signed)
Referral sent 

## 2022-02-20 ENCOUNTER — Other Ambulatory Visit: Payer: Self-pay

## 2022-02-20 ENCOUNTER — Telehealth: Payer: Self-pay | Admitting: Family Medicine

## 2022-02-20 MED ORDER — OLMESARTAN MEDOXOMIL-HCTZ 40-25 MG PO TABS: 1.0000 | ORAL_TABLET | Freq: Every day | ORAL | 3 refills | Status: DC

## 2022-02-20 MED ORDER — ATENOLOL 50 MG PO TABS: 50.0000 mg | ORAL_TABLET | Freq: Every day | ORAL | 0 refills | Status: DC

## 2022-02-20 NOTE — Telephone Encounter (Signed)
Pt called stating she is needing refills. Can you please refill?   olmesartan-hydrochlorothiazide (BENICAR HCT) 40-25 MG tablet   atenolol (TENORMIN) 50 MG tablet  [facility] [place]

## 2022-02-20 NOTE — Telephone Encounter (Signed)
Refills sent

## 2022-02-22 ENCOUNTER — Encounter: Payer: Self-pay | Admitting: *Deleted

## 2022-03-13 ENCOUNTER — Encounter: Payer: Self-pay | Admitting: *Deleted

## 2022-03-13 NOTE — Patient Instructions (Signed)
  Procedure: Colonoscopy  Estimated body mass index is 28.87 kg/m as calculated from the following:   Height as of this encounter: '5\' 3"'$  (1.6 m).   Weight as of this encounter: 163 lb (73.9 kg).   Have you had a colonoscopy before?  no  Do you have family history of colon cancer  no  Do you have a family history of polyps? no  Previous colonoscopy with polyps removed? no  Do you have a history colorectal cancer?   no  Are you diabetic?  no  Do you have a prosthetic or mechanical heart valve? no  Do you have a pacemaker/defibrillator?   no  Have you had endocarditis/atrial fibrillation?  no  Do you use supplemental oxygen/CPAP?  no  Have you had joint replacement within the last 12 months?  no  Do you tend to be constipated or have to use laxatives?  no   Do you have history of alcohol use? If yes, how much and how often.  no  Do you have history or are you using drugs? If yes, what do are you  using?  no  Have you ever had a stroke/heart attack?  no  Have you ever had a heart or other vascular stent placed,?no  Do you take weight loss medication? no  female patients,: have you had a hysterectomy? yes                              are you post menopausal?  no                              do you still have your menstrual cycle? no    Date of last menstrual period.   Do you take any blood-thinning medications such as: (Plavix, aspirin, Coumadin, Aggrenox, Brilinta, Xarelto, Eliquis, Pradaxa, Savaysa or Effient) no  If yes we need the name, milligram, dosage and who is prescribing doctor:               Current Outpatient Medications  Medication Sig Dispense Refill   acetaminophen (TYLENOL) 500 MG tablet Take 500 mg by mouth as needed.     atenolol (TENORMIN) 50 MG tablet Take 1 tablet (50 mg total) by mouth daily. 90 tablet 0   Cholecalciferol (VITAMIN D3) 50 MCG (2000 UT) TABS Take by mouth 4 (four) times a week.     olmesartan-hydrochlorothiazide (BENICAR HCT)  40-25 MG tablet Take 1 tablet by mouth daily. 30 tablet 3   No current facility-administered medications for this visit.    Allergies  Allergen Reactions   Oxycodone-Acetaminophen Itching

## 2022-03-15 ENCOUNTER — Other Ambulatory Visit: Payer: Self-pay

## 2022-03-15 ENCOUNTER — Encounter: Payer: Self-pay | Admitting: Family Medicine

## 2022-03-15 MED ORDER — OLMESARTAN MEDOXOMIL-HCTZ 40-25 MG PO TABS: 1.0000 | ORAL_TABLET | Freq: Every day | ORAL | 2 refills | Status: DC

## 2022-03-15 MED ORDER — ATENOLOL 50 MG PO TABS: 50.0000 mg | ORAL_TABLET | Freq: Every day | ORAL | 2 refills | Status: DC

## 2022-03-22 ENCOUNTER — Encounter: Payer: Self-pay | Admitting: *Deleted

## 2022-03-22 DIAGNOSIS — Z1211 Encounter for screening for malignant neoplasm of colon: Secondary | ICD-10-CM

## 2022-03-22 NOTE — Progress Notes (Signed)
Spoke with pt. She has been scheduled for 12/29 at 10am. Aware will send instructions. Wants a sample prep bc insurance does not cover anything for her for rx.

## 2022-03-22 NOTE — Progress Notes (Signed)
Referral completed

## 2022-03-22 NOTE — Progress Notes (Signed)
OK to schedule. ASA 2. If she is still on Benicar HCT she will need BMET.

## 2022-04-19 ENCOUNTER — Telehealth: Payer: Self-pay | Admitting: *Deleted

## 2022-04-19 NOTE — Telephone Encounter (Signed)
Pt left vm wanting to know if the sample of the prep would be ready to pick up.  LMOVM that prep was up front waiting to be picked up

## 2022-04-25 ENCOUNTER — Other Ambulatory Visit (HOSPITAL_COMMUNITY)
Admission: RE | Admit: 2022-04-25 | Discharge: 2022-04-25 | Disposition: A | Discharge status: Home / Self Care | Payer: 59 | Source: Ambulatory Visit | Attending: Internal Medicine | Admitting: Internal Medicine

## 2022-04-25 DIAGNOSIS — Z1211 Encounter for screening for malignant neoplasm of colon: Secondary | ICD-10-CM | POA: Diagnosis not present

## 2022-04-25 LAB — BASIC METABOLIC PANEL
Anion gap: 10 (ref 5–15)
BUN: 19 mg/dL (ref 6–20)
CO2: 25 mmol/L (ref 22–32)
Calcium: 9.5 mg/dL (ref 8.9–10.3)
Chloride: 104 mmol/L (ref 98–111)
Creatinine, Ser: 0.76 mg/dL (ref 0.44–1.00)
GFR, Estimated: >60 mL/min (ref >60)
Glucose, Bld: 108 mg/dL — ABNORMAL HIGH (ref 70–99)
Potassium: 3.8 mmol/L (ref 3.5–5.1)
Sodium: 139 mmol/L (ref 135–145)

## 2022-04-28 ENCOUNTER — Encounter (HOSPITAL_COMMUNITY): Payer: Self-pay

## 2022-04-28 ENCOUNTER — Encounter (HOSPITAL_COMMUNITY)
Admission: RE | Disposition: A | Discharge status: Home / Self Care | Payer: Self-pay | Source: Home / Self Care | Attending: Internal Medicine

## 2022-04-28 ENCOUNTER — Ambulatory Visit (HOSPITAL_COMMUNITY)
Admission: RE | Admit: 2022-04-28 | Discharge: 2022-04-28 | Disposition: A | Discharge status: Home / Self Care | Payer: 59 | Attending: Internal Medicine | Admitting: Internal Medicine

## 2022-04-28 ENCOUNTER — Other Ambulatory Visit: Payer: Self-pay

## 2022-04-28 ENCOUNTER — Ambulatory Visit (HOSPITAL_BASED_OUTPATIENT_CLINIC_OR_DEPARTMENT_OTHER): Payer: 59 | Admitting: Anesthesiology

## 2022-04-28 ENCOUNTER — Ambulatory Visit (HOSPITAL_COMMUNITY): Payer: 59 | Admitting: Anesthesiology

## 2022-04-28 DIAGNOSIS — D649 Anemia, unspecified: Secondary | ICD-10-CM | POA: Diagnosis not present

## 2022-04-28 DIAGNOSIS — D125 Benign neoplasm of sigmoid colon: Secondary | ICD-10-CM

## 2022-04-28 DIAGNOSIS — Z79899 Other long term (current) drug therapy: Secondary | ICD-10-CM | POA: Insufficient documentation

## 2022-04-28 DIAGNOSIS — K5730 Diverticulosis of large intestine without perforation or abscess without bleeding: Secondary | ICD-10-CM | POA: Diagnosis not present

## 2022-04-28 DIAGNOSIS — F17210 Nicotine dependence, cigarettes, uncomplicated: Secondary | ICD-10-CM | POA: Insufficient documentation

## 2022-04-28 DIAGNOSIS — Z1211 Encounter for screening for malignant neoplasm of colon: Secondary | ICD-10-CM

## 2022-04-28 DIAGNOSIS — I10 Essential (primary) hypertension: Secondary | ICD-10-CM | POA: Insufficient documentation

## 2022-04-28 DIAGNOSIS — K635 Polyp of colon: Secondary | ICD-10-CM | POA: Insufficient documentation

## 2022-04-28 DIAGNOSIS — Z8249 Family history of ischemic heart disease and other diseases of the circulatory system: Secondary | ICD-10-CM | POA: Insufficient documentation

## 2022-04-28 DIAGNOSIS — Z139 Encounter for screening, unspecified: Secondary | ICD-10-CM | POA: Diagnosis not present

## 2022-04-28 DIAGNOSIS — R69 Illness, unspecified: Secondary | ICD-10-CM | POA: Diagnosis not present

## 2022-04-28 HISTORY — PX: COLONOSCOPY WITH PROPOFOL: SHX5780

## 2022-04-28 HISTORY — PX: POLYPECTOMY: SHX5525

## 2022-04-28 SURGERY — COLONOSCOPY WITH PROPOFOL
Anesthesia: General

## 2022-04-28 MED ORDER — PROPOFOL 10 MG/ML IV BOLUS
INTRAVENOUS | Status: DC | PRN
  Administered 2022-04-28: 80 mg via INTRAVENOUS

## 2022-04-28 MED ORDER — LIDOCAINE HCL (CARDIAC) PF 100 MG/5ML IV SOSY
PREFILLED_SYRINGE | INTRAVENOUS | Status: DC | PRN
  Administered 2022-04-28: 50 mg via INTRATRACHEAL

## 2022-04-28 MED ORDER — LACTATED RINGERS IV SOLN: INTRAVENOUS | Status: DC

## 2022-04-28 MED ORDER — PROPOFOL 500 MG/50ML IV EMUL
INTRAVENOUS | Status: DC | PRN
  Administered 2022-04-28: 200 ug/kg/min via INTRAVENOUS

## 2022-04-28 NOTE — Op Note (Signed)
[facility] Patient Name: Kathy Stewart Procedure Date: 04/28/2022 9:38 AM MRN: [id number] Date of Birth: Dec 08, 1970 Attending MD: Elon Alas. Abbey Chatters , [place], [id number] CSN: [id number] Age: 51 Admit Type: Outpatient Procedure:                Colonoscopy Indications:              Screening for colorectal malignant neoplasm Providers:                Elon Alas. Abbey Chatters, DO, Tammy Vaught, RN, Everardo Pacific Referring MD:              Medicines:                See the Anesthesia note for documentation of the                            administered medications Complications:            No immediate complications. Estimated Blood Loss:     Estimated blood loss was minimal. Procedure:                Pre-Anesthesia Assessment:                           - The anesthesia plan was to use monitored                            anesthesia care (MAC).                           After obtaining informed consent, the colonoscope                            was passed under direct vision. Throughout the                            procedure, the patient's blood pressure, pulse, and                            oxygen saturations were monitored continuously. The                            [id number]-HQ190L ([id number]) scope was introduced through                            the anus and advanced to the the cecum, identified                            by appendiceal orifice and ileocecal valve. The                            colonoscopy was performed without difficulty. The                            patient tolerated the procedure well. The  quality                            of the bowel preparation was evaluated using the                            BBPS [facility] Bowel Preparation Scale) with scores                            of: Right Colon = 3, Transverse Colon = 3 and Left                            Colon = 3 (entire mucosa seen well with no residual                             staining, small fragments of stool or opaque                            liquid). The total BBPS score equals 9. Scope In: 9:48:49 AM Scope Out: 9:58:44 AM Scope Withdrawal Time: 0 hours 8 minutes 8 seconds  Total Procedure Duration: 0 hours 9 minutes 55 seconds  Findings:      The perianal and digital rectal examinations were normal.      Multiple medium-mouthed diverticula were found in the transverse colon       and ascending colon.      Two sessile polyps were found in the sigmoid colon. The polyps were 5 to       6 mm in size. These polyps were removed with a cold snare. Resection and       retrieval were complete.      The exam was otherwise without abnormality. Impression:               - Diverticulosis in the transverse colon and in the                            ascending colon.                           - Two 5 to 6 mm polyps in the sigmoid colon,                            removed with a cold snare. Resected and retrieved.                           - The examination was otherwise normal. Moderate Sedation:      Per Anesthesia Care Recommendation:           - Patient has a contact number available for                            emergencies. The signs and symptoms of potential                            delayed complications were discussed with the  patient. Return to normal activities tomorrow.                            Written discharge instructions were provided to the                            patient.                           - Resume previous diet.                           - Continue present medications.                           - Await pathology results.                           - Repeat colonoscopy in 5-10 years for surveillance.                           - Return to GI clinic PRN. Procedure Code(s):        --- Professional ---                           [phone], Colonoscopy, flexible; with removal of                            tumor(s),  polyp(s), or other lesion(s) by snare                            technique Diagnosis Code(s):        --- Professional ---                           Z12.11, Encounter for screening for malignant                            neoplasm of colon                           D12.5, Benign neoplasm of sigmoid colon                           K57.30, Diverticulosis of large intestine without                            perforation or abscess without bleeding CPT copyright 2022 American Medical Association. All rights reserved. The codes documented in this report are preliminary and upon coder review may  be revised to meet current compliance requirements. Elon Alas. Abbey Chatters, DO [place] Abbey Chatters, DO 04/28/2022 10:02:44 AM This report has been signed electronically. Number of Addenda: 0

## 2022-04-28 NOTE — Anesthesia Preprocedure Evaluation (Addendum)
Anesthesia Evaluation  Patient identified by MRN, date of birth, ID band Patient awake    Reviewed: Allergy & Precautions, H&P , NPO status , Patient's Chart, lab work & pertinent test results  Airway Mallampati: II  TM Distance: >3 FB Neck ROM: Full   Comment: Cervical neck pain with evidence of disc disease Dental  (+) Dental Advisory Given, Teeth Intact   Pulmonary Current Smoker and Patient abstained from smoking.   Pulmonary exam normal breath sounds clear to auscultation       Cardiovascular hypertension, Pt. on medications Normal cardiovascular exam Rhythm:Regular Rate:Normal     Neuro/Psych negative neurological ROS  negative psych ROS   GI/Hepatic negative GI ROS, Neg liver ROS,,,  Endo/Other  negative endocrine ROS    Renal/GU negative Renal ROS  negative genitourinary   Musculoskeletal negative musculoskeletal ROS (+)    Abdominal   Peds negative pediatric ROS (+)  Hematology  (+) Blood dyscrasia, anemia   Anesthesia Other Findings   Reproductive/Obstetrics negative OB ROS                             Anesthesia Physical Anesthesia Plan  ASA: 2  Anesthesia Plan: General   Post-op Pain Management: Minimal or no pain anticipated   Induction: Intravenous  PONV Risk Score and Plan: Propofol infusion  Airway Management Planned: Nasal Cannula and Natural Airway  Additional Equipment:   Intra-op Plan:   Post-operative Plan:   Informed Consent: I have reviewed the patients History and Physical, chart, labs and discussed the procedure including the risks, benefits and alternatives for the proposed anesthesia with the patient or authorized representative who has indicated his/her understanding and acceptance.     Dental advisory given  Plan Discussed with: CRNA  Anesthesia Plan Comments:        Anesthesia Quick Evaluation

## 2022-04-28 NOTE — Anesthesia Postprocedure Evaluation (Signed)
Anesthesia Post Note  Patient: Kathy Stewart  Procedure(s) Performed: COLONOSCOPY WITH PROPOFOL POLYPECTOMY  Patient location during evaluation: Phase II Anesthesia Type: General Level of consciousness: awake and alert and oriented Pain management: pain level controlled Vital Signs Assessment: post-procedure vital signs reviewed and stable Respiratory status: spontaneous breathing, nonlabored ventilation and respiratory function stable Cardiovascular status: blood pressure returned to baseline and stable Postop Assessment: no apparent nausea or vomiting Anesthetic complications: no  No notable events documented.   Last Vitals:  Vitals:   04/28/22 0848 04/28/22 1001  BP: 107/78 105/62  Pulse: 60 75  Resp: 12 13  Temp: 36.9 C 36.7 C  SpO2: 98% 98%    Last Pain:  Vitals:   04/28/22 1001  TempSrc: Oral  PainSc: 0-No pain                 Maryjayne Kleven C Aeric Burnham

## 2022-04-28 NOTE — H&P (Signed)
Primary Care Physician:  Fayrene Helper, MD Primary Gastroenterologist:  Dr. Abbey Chatters  Pre-Procedure History & Physical: HPI:  Kathy Stewart is a 51 y.o. female is here for first ever colonoscopy for colon cancer screening purposes.  Patient denies any family history of colorectal cancer.  No melena or hematochezia.  No abdominal pain or unintentional weight loss.  No change in bowel habits.  Overall feels well from a GI standpoint.  Past Medical History:  Diagnosis Date   Allergy 1990   spring allergies   Anemia    Hypertension 01/2014   Nicotine dependence 1997   current in 2016 approx 5 per day    Past Surgical History:  Procedure Laterality Date   ABDOMINAL HYSTERECTOMY  11/2012   fibroids, has one ovary   BILATERAL SALPINGECTOMY Bilateral 12/17/2012   Procedure: BILATERAL SALPINGECTOMY;  Surgeon: Jonnie Kind, MD;  Location: [facility];  Service: Gynecology;  Laterality: Bilateral;   CESAREAN SECTION  1992   SALPINGOOPHORECTOMY Left 12/17/2012   Procedure: SALPINGO OOPHORECTOMY;  Surgeon: Jonnie Kind, MD;  Location: [facility];  Service: Gynecology;  Laterality: Left;   SUPRACERVICAL ABDOMINAL HYSTERECTOMY N/A 12/17/2012   Procedure: ABDOMINAL SUPRACERVICAL HYSTERECTOMY;  Surgeon: Jonnie Kind, MD;  Location: [facility];  Service: Gynecology;  Laterality: N/A;   TONSILLECTOMY      Prior to Admission medications   Medication Sig Start Date End Date Taking? Authorizing Provider  acetaminophen (TYLENOL) 500 MG tablet Take 500 mg by mouth every 8 (eight) hours as needed for headache.   Yes [provider]  atenolol (TENORMIN) 50 MG tablet Take 1 tablet (50 mg total) by mouth daily. 03/15/22  Yes Fayrene Helper, MD  Cholecalciferol (VITAMIN D3) 125 MCG (5000 UT) TABS Take 5,000 Units by mouth daily.   Yes [provider]  ketotifen (ZADITOR) 0.025 % ophthalmic solution Place 1 drop into both eyes daily as needed (itching eye).   Yes [provider]   Multiple Vitamins-Minerals (MULTIVITAMIN WITH MINERALS) tablet Take 1 tablet by mouth once a week.   Yes [provider]  olmesartan-hydrochlorothiazide (BENICAR HCT) 40-25 MG tablet Take 1 tablet by mouth daily. 03/15/22  Yes Fayrene Helper, MD    Allergies as of 03/22/2022 - Review Complete 01/02/2022  Allergen Reaction Noted   Oxycodone-acetaminophen Itching 05/21/2007    Family History  Problem Relation Age of Onset   Hypertension Mother    Kidney disease Mother    Cancer Father        bladder   Hypertension Maternal Grandmother    Diabetes Maternal Grandmother    Cataracts Maternal Grandmother    Breast cancer Paternal Grandmother     Social History   Socioeconomic History   Marital status: Single    Spouse name: Not on file   Number of children: Not on file   Years of education: Not on file   Highest education level: Not on file  Occupational History   Not on file  Tobacco Use   Smoking status: Every Day    Packs/day: 0.25    Years: 17.00    Total pack years: 4.25    Types: Cigarettes   Smokeless tobacco: Never  Vaping Use   Vaping Use: Never used  Substance and Sexual Activity   Alcohol use: Yes    Comment: social   Drug use: No   Sexual activity: Never  Other Topics Concern   Not on file  Social History Narrative   Not on file  Social Determinants of Health   Financial Resource Strain: Not on file  Food Insecurity: Not on file  Transportation Needs: Not on file  Physical Activity: Not on file  Stress: Not on file  Social Connections: Not on file  Intimate Partner Violence: Not on file    Review of Systems: See HPI, otherwise negative ROS  Physical Exam: Vital signs in last 24 hours: Temp:  [98.5 F (36.9 C)] 98.5 F (36.9 C) (12/29 0848) Pulse Rate:  [60] 60 (12/29 0848) Resp:  [12] 12 (12/29 0848) BP: (107)/(78) 107/78 (12/29 0848) SpO2:  [98 %] 98 % (12/29 0848) Weight:  [74.8 kg] 74.8 kg (12/29 0848)   General:    Alert,  Well-developed, well-nourished, pleasant and cooperative in NAD Head:  Normocephalic and atraumatic. Eyes:  Sclera clear, no icterus.   Conjunctiva pink. Ears:  Normal auditory acuity. Nose:  No deformity, discharge,  or lesions. Msk:  Symmetrical without gross deformities. Normal posture. Extremities:  Without clubbing or edema. Neurologic:  Alert and  oriented x4;  grossly normal neurologically. Skin:  Intact without significant lesions or rashes. Psych:  Alert and cooperative. Normal mood and affect.  Impression/Plan: Kathy Stewart is here for a colonoscopy to be performed for colon cancer screening purposes.  The risks of the procedure including infection, bleed, or perforation as well as benefits, limitations, alternatives and imponderables have been reviewed with the patient. Questions have been answered. All parties agreeable.

## 2022-04-28 NOTE — Transfer of Care (Signed)
Immediate Anesthesia Transfer of Care Note  Patient: Kathy Stewart  Procedure(s) Performed: COLONOSCOPY WITH PROPOFOL POLYPECTOMY  Patient Location: Endoscopy Unit  Anesthesia Type:General  Level of Consciousness: awake, alert , oriented, and patient cooperative  Airway & Oxygen Therapy: Patient Spontanous Breathing  Post-op Assessment: Report given to RN, Post -op Vital signs reviewed and stable, and Patient moving all extremities  Post vital signs: Reviewed and stable  Last Vitals:  Vitals Value Taken Time  BP 105/62 04/28/22 1001  Temp 36.7 C 04/28/22 1001  Pulse 75 04/28/22 1001  Resp 13 04/28/22 1001  SpO2 98 % 04/28/22 1001    Last Pain:  Vitals:   04/28/22 1001  TempSrc: Oral  PainSc: 0-No pain         Complications: No notable events documented.

## 2022-04-28 NOTE — Discharge Instructions (Addendum)
  Colonoscopy Discharge Instructions  Read the instructions outlined below and refer to this sheet in the next few weeks. These discharge instructions provide you with general information on caring for yourself after you leave the hospital. Your doctor may also give you specific instructions. While your treatment has been planned according to the most current medical practices available, unavoidable complications occasionally occur.   ACTIVITY You may resume your regular activity, but move at a slower pace for the next 24 hours.  Take frequent rest periods for the next 24 hours.  Walking will help get rid of the air and reduce the bloated feeling in your belly (abdomen).  No driving for 24 hours (because of the medicine (anesthesia) used during the test).   Do not sign any important legal documents or operate any machinery for 24 hours (because of the anesthesia used during the test).  NUTRITION Drink plenty of fluids.  You may resume your normal diet as instructed by your doctor.  Begin with a light meal and progress to your normal diet. Heavy or fried foods are harder to digest and may make you feel sick to your stomach (nauseated).  Avoid alcoholic beverages for 24 hours or as instructed.  MEDICATIONS You may resume your normal medications unless your doctor tells you otherwise.  WHAT YOU CAN EXPECT TODAY Some feelings of bloating in the abdomen.  Passage of more gas than usual.  Spotting of blood in your stool or on the toilet paper.  IF YOU HAD POLYPS REMOVED DURING THE COLONOSCOPY: No aspirin products for 7 days or as instructed.  No alcohol for 7 days or as instructed.  Eat a soft diet for the next 24 hours.  FINDING OUT THE RESULTS OF YOUR TEST Not all test results are available during your visit. If your test results are not back during the visit, make an appointment with your caregiver to find out the results. Do not assume everything is normal if you have not heard from your  caregiver or the medical facility. It is important for you to follow up on all of your test results.  SEEK IMMEDIATE MEDICAL ATTENTION IF: You have more than a spotting of blood in your stool.  Your belly is swollen (abdominal distention).  You are nauseated or vomiting.  You have a temperature over 101.  You have abdominal pain or discomfort that is severe or gets worse throughout the day.   Your colonoscopy revealed 2 polyp(s) which I removed successfully. These are likely benign, hyperplastic. Await pathology results, my office will contact you. I recommend repeating colonoscopy in 5-10 years for surveillance purposes depending on pathology results.   You also have diverticulosis. I would recommend increasing fiber in your diet or adding OTC Benefiber/Metamucil. Be sure to drink at least 4 to 6 glasses of water daily. Follow-up with GI as needed.   I hope you have a great rest of your week!  Elon Alas. Abbey Chatters, D.O. Gastroenterology and Hepatology [facility]

## 2022-05-03 LAB — SURGICAL PATHOLOGY

## 2022-05-05 ENCOUNTER — Encounter (HOSPITAL_COMMUNITY): Payer: Self-pay | Admitting: Internal Medicine

## 2022-06-01 ENCOUNTER — Ambulatory Visit: Payer: 59

## 2022-06-01 ENCOUNTER — Ambulatory Visit
Admission: RE | Admit: 2022-06-01 | Discharge: 2022-06-01 | Disposition: A | Discharge status: Home / Self Care | Payer: 59 | Source: Ambulatory Visit | Attending: Family Medicine | Admitting: Family Medicine

## 2022-06-02 ENCOUNTER — Telehealth (INDEPENDENT_AMBULATORY_CARE_PROVIDER_SITE_OTHER): Payer: 59 | Admitting: Internal Medicine

## 2022-06-02 ENCOUNTER — Encounter: Payer: Self-pay | Admitting: Internal Medicine

## 2022-06-02 DIAGNOSIS — U071 COVID-19: Secondary | ICD-10-CM | POA: Diagnosis not present

## 2022-06-02 MED ORDER — NIRMATRELVIR/RITONAVIR (PAXLOVID)TABLET: 3.0000 | ORAL_TABLET | Freq: Two times a day (BID) | ORAL | 0 refills | Status: AC

## 2022-06-02 NOTE — Progress Notes (Signed)
Virtual Visit via Video Note  I connected with Kathy Stewart on 06/02/22 at  2:20 PM EST by a video enabled telemedicine application and verified that I am speaking with the correct person using two identifiers.  Patient Location: Home Provider Location: Office/Clinic  I discussed the limitations, risks, security, and privacy concerns of performing an evaluation and management service by video and the availability of in person appointments. I also discussed with the patient that there may be a patient responsible charge related to this service. The patient expressed understanding and agreed to proceed.  Subjective: PCP: Kathy Helper, MD  Chief Complaint  Patient presents with   Covid Positive    Tested positive 06/02/22. Symptoms: headache, stopped up nose, cough, chills   Kathy Stewart has been evaluated through an acute video visit after testing positive for COVID-19 earlier today.  She reports onset of symptoms overnight that include nasal congestion, headache, fatigue, nonproductive cough, and a fever with Tmax up to 101.7F.  She states that one of her coworkers came to work earlier this week with similar symptoms and subsequently tested positive COVID-19.  Kathy Stewart is vaccinated against COVID-19 and has received multiple booster vaccinations.  She has been taking Tylenol and OTC cough/cold medication for symptom relief.  ROS: Per HPI  Current Outpatient Medications:    acetaminophen (TYLENOL) 500 MG tablet, Take 500 mg by mouth every 8 (eight) hours as needed for headache., Disp: , Rfl:    atenolol (TENORMIN) 50 MG tablet, Take 1 tablet (50 mg total) by mouth daily., Disp: 90 tablet, Rfl: 2   Cholecalciferol (VITAMIN D3) 125 MCG (5000 UT) TABS, Take 5,000 Units by mouth daily., Disp: , Rfl:    ketotifen (ZADITOR) 0.025 % ophthalmic solution, Place 1 drop into both eyes daily as needed (itching eye)., Disp: , Rfl:    Multiple Vitamins-Minerals (MULTIVITAMIN WITH MINERALS)  tablet, Take 1 tablet by mouth once a week., Disp: , Rfl:    nirmatrelvir/ritonavir (PAXLOVID) 20 x 150 MG & 10 x '100MG'$  TABS, Take 3 tablets by mouth 2 (two) times daily for 5 days. (Take nirmatrelvir 150 mg two tablets twice daily for 5 days and ritonavir 100 mg one tablet twice daily for 5 days) Patient GFR is >60, Disp: 30 tablet, Rfl: 0   olmesartan-hydrochlorothiazide (BENICAR HCT) 40-25 MG tablet, Take 1 tablet by mouth daily., Disp: 90 tablet, Rfl: 2  Assessment and Plan: COVID-19 Assessment & Plan: Symptom onset and COVID-19 positive today (06/02/22).  She endorses nasal congestion, headache, fatigue, fever, and nonproductive cough.  She has been taking Tylenol and OTC cough/cold medication for symptom relief.  She is vaccinated against COVID-19. -I have prescribed Paxlovid x 5 days as she is within 5 days of symptom onset.  GFR > 60 when checked in late December 2023 -I recommended continued supportive care measures including use of Tylenol and OTC cough/cold medication as needed.  I also stressed the importance of maintaining adequate hydration. -She was instructed to quarantine through day 5 from symptom onset.  She may leave her home beginning day 6 if she has been afebrile for over 24 hours and is wearing a mask.  She can discontinue isolation precautions after day 10. -She was also instructed to return to care if she develops significant shortness of breath or generally begins to feel worse  Follow Up Instructions: Return if symptoms worsen or fail to improve.   I discussed the assessment and treatment plan with the patient. The patient was  provided an opportunity to ask questions, and all were answered. The patient agreed with the plan and demonstrated an understanding of the instructions.   The patient was advised to call back or seek an in-person evaluation if the symptoms worsen or if the condition fails to improve as anticipated.  The above assessment and management plan was  discussed with the patient. The patient verbalized understanding of and has agreed to the management plan.   Johnette Abraham, MD

## 2022-06-02 NOTE — Assessment & Plan Note (Signed)
Symptom onset and COVID-19 positive today (06/02/22).  She endorses nasal congestion, headache, fatigue, fever, and nonproductive cough.  She has been taking Tylenol and OTC cough/cold medication for symptom relief.  She is vaccinated against COVID-19. -I have prescribed Paxlovid x 5 days as she is within 5 days of symptom onset.  GFR > 60 when checked in late December 2023 -I recommended continued supportive care measures including use of Tylenol and OTC cough/cold medication as needed.  I also stressed the importance of maintaining adequate hydration. -She was instructed to quarantine through day 5 from symptom onset.  She may leave her home beginning day 6 if she has been afebrile for over 24 hours and is wearing a mask.  She can discontinue isolation precautions after day 10. -She was also instructed to return to care if she develops significant shortness of breath or generally begins to feel worse

## 2022-09-27 ENCOUNTER — Encounter: Payer: Self-pay | Admitting: Family Medicine

## 2023-02-20 ENCOUNTER — Ambulatory Visit (INDEPENDENT_AMBULATORY_CARE_PROVIDER_SITE_OTHER): Payer: BC Managed Care – PPO

## 2023-02-20 DIAGNOSIS — Z23 Encounter for immunization: Secondary | ICD-10-CM | POA: Diagnosis not present

## 2023-02-28 ENCOUNTER — Other Ambulatory Visit: Payer: Self-pay | Admitting: Family Medicine

## 2023-03-13 ENCOUNTER — Encounter: Payer: Self-pay | Admitting: Family Medicine

## 2023-03-13 ENCOUNTER — Ambulatory Visit (INDEPENDENT_AMBULATORY_CARE_PROVIDER_SITE_OTHER): Payer: BC Managed Care – PPO | Admitting: Family Medicine

## 2023-03-13 VITALS — BP 126/80 | HR 80 | Ht 62.0 in | Wt 159.1 lb

## 2023-03-13 DIAGNOSIS — J302 Other seasonal allergic rhinitis: Secondary | ICD-10-CM

## 2023-03-13 DIAGNOSIS — E663 Overweight: Secondary | ICD-10-CM

## 2023-03-13 DIAGNOSIS — F17200 Nicotine dependence, unspecified, uncomplicated: Secondary | ICD-10-CM

## 2023-03-13 DIAGNOSIS — Z1231 Encounter for screening mammogram for malignant neoplasm of breast: Secondary | ICD-10-CM

## 2023-03-13 DIAGNOSIS — R7303 Prediabetes: Secondary | ICD-10-CM

## 2023-03-13 DIAGNOSIS — M5090 Cervical disc disorder, unspecified, unspecified cervical region: Secondary | ICD-10-CM

## 2023-03-13 DIAGNOSIS — G5601 Carpal tunnel syndrome, right upper limb: Secondary | ICD-10-CM | POA: Insufficient documentation

## 2023-03-13 DIAGNOSIS — I10 Essential (primary) hypertension: Secondary | ICD-10-CM | POA: Diagnosis not present

## 2023-03-13 DIAGNOSIS — E785 Hyperlipidemia, unspecified: Secondary | ICD-10-CM | POA: Diagnosis not present

## 2023-03-13 DIAGNOSIS — M25511 Pain in right shoulder: Secondary | ICD-10-CM

## 2023-03-13 DIAGNOSIS — M79641 Pain in right hand: Secondary | ICD-10-CM

## 2023-03-13 DIAGNOSIS — R7989 Other specified abnormal findings of blood chemistry: Secondary | ICD-10-CM

## 2023-03-13 MED ORDER — MONTELUKAST SODIUM 10 MG PO TABS: 10.0000 mg | ORAL_TABLET | Freq: Every day | ORAL | 3 refills | Status: AC

## 2023-03-13 MED ORDER — FLONASE SENSIMIST 27.5 MCG/SPRAY NA SUSP: 2.0000 | Freq: Every day | NASAL | 12 refills | Status: DC

## 2023-03-13 NOTE — Assessment & Plan Note (Signed)
Controlled, no change in medication DASH diet and commitment to daily physical activity for a minimum of 30 minutes discussed and encouraged, as a part of hypertension management. The importance of attaining a healthy weight is also discussed.     03/13/2023   11:11 AM 04/28/2022   10:01 AM 04/28/2022    8:48 AM 03/13/2022    4:36 PM 12/30/2021    3:52 PM 12/30/2021    3:41 PM 11/16/2021    9:01 AM  BP/Weight  Systolic BP 126 105 107  110 115 158  Diastolic BP 80 62 78  70 69 90  Wt. (Lbs) 159.12  165 163  163.12   BMI 29.1 kg/m2  29.23 kg/m2 28.87 kg/m2  29.84 kg/m2      '

## 2023-03-13 NOTE — Assessment & Plan Note (Signed)
Hyperlipidemia:Low fat diet discussed and encouraged.   Lipid Panel  Lab Results  Component Value Date   CHOL 229 (H) 06/21/2021   HDL 54 06/21/2021   LDLCALC 150 (H) 06/21/2021   TRIG 138 06/21/2021   CHOLHDL 4.2 06/21/2021     Updated lab needed at/ before next visit.

## 2023-03-13 NOTE — Assessment & Plan Note (Signed)
Paion in righht shoulder following injury on the job on 03/03/2023

## 2023-03-13 NOTE — Progress Notes (Signed)
Kathy Stewart     MRN: [id number]      DOB: [birth year]  Chief Complaint  Patient presents with   Follow-up    ER follow up shoulder injury, cold since Thursday     HPI Kathy Stewart is here for follow up and re-evaluation of chronic medical conditions, medication management and review of any available recent lab and radiology data.  Preventive health is updated, specifically  Cancer screening and Immunization.   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 Workman's comp case an she needs Ortho management asap. Still working but has limitations in her ability to function because of the injury Also c/o increased pain and numbness in right hand since her injury , he has an established dx of right carpakl tunnel ROS: Denies recent fever or chills. Denies , ear pain or sore throat. Denies chest congestion, productive cough or wheezing. Denies chest pains, palpitations and leg swelling Denies abdominal pain, nausea, vomiting,diarrhea or constipation.   Denies dysuria, frequency, hesitancy or incontinence. Denies headaches, seizures, numbness, or tingling. Denies depression, anxiety or insomnia. Denies skin break down or rash.   PE  BP 126/80 (BP Location: Right Arm, Patient Position: Sitting, Cuff Size: Large)   Pulse 80   Ht 5\' 2"  (1.575 m)   Wt 159 lb 1.9 oz (72.2 kg)   LMP 10/17/2012   SpO2 94%   BMI 29.10 kg/m   Patient alert and oriented and in no cardiopulmonary distress.  HEENT: No facial asymmetry, EOMI,     Neck supple .  Chest: Clear to auscultation bilaterally.  CVS: S1, S2 no murmurs, no S3.Regular rate.  ABD: Soft non tender.   Ext: No edema  MS:  Adequate ROM spine, hips and knees.Tender over posterior right shoulder with decreased ROM  Skin: Intact, no ulcerations or rash noted.  Psych: Good eye contact, normal affect. Memory intact not anxious or depressed appearing.  CNS: CN 2-12 intact, power,  normal throughout.no focal deficits noted.   Assessment & Plan  Acute pain of right shoulder Paion in righht shoulder following injury on the job on 03/03/2023  Essential hypertension Controlled, no change in medication DASH diet and commitment to daily physical activity for a minimum of 30 minutes discussed and encouraged, as a part of hypertension management. The importance of attaining a healthy weight is also discussed.     03/13/2023   11:11 AM 04/28/2022   10:01 AM 04/28/2022    8:48 AM 03/13/2022    4:36 PM 12/30/2021    3:52 PM 12/30/2021    3:41 PM 11/16/2021    9:01 AM  BP/Weight  Systolic BP 126 105 107  110 115 158  Diastolic BP 80 62 78  70 69 90  Wt. (Lbs) 159.12  165 163  163.12   BMI 29.1 kg/m2  29.23 kg/m2 28.87 kg/m2  29.84 kg/m2      '  NICOTINE ADDICTION Asked:confirms currently smokes cigarettes Assess: Unwilling to set a quit date, but is cutting back Advise: needs to QUIT to reduce risk of cancer, cardio and cerebrovascular disease Assist: counseled for 5 minutes and literature provided Arrange: follow up in 2 to  4 months   Cervical neck pain with evidence of disc disease Reports increased pain and numbness in right hand following acute injury , Ortho to further evaluate  Hyperlipidemia LDL goal <130 Hyperlipidemia:Low fat diet discussed and encouraged.   Lipid Panel  Lab Results  Component Value Date   CHOL 229 (H) 06/21/2021   HDL 54 06/21/2021   LDLCALC 150 (H) 06/21/2021   TRIG 138 06/21/2021   CHOLHDL 4.2 06/21/2021     Updated lab needed at/ before next visit.   Prediabetes Patient educated about the importance of limiting  Carbohydrate intake , the need to commit to daily  physical activity for a minimum of 30 minutes , and to commit weight loss. The fact that changes in all these areas will reduce or eliminate all together the development of diabetes is stressed.      Latest Ref Rng & Units 04/25/2022    9:50 AM 06/21/2021    8:01 AM 12/23/2016    9:52 AM 11/27/2015    9:47 AM 12/02/2014    9:38 AM  Diabetic Labs  HbA1c <5.7 %   5.6  5.6  5.8   Chol 100 - 199 mg/dL  347  425  956  387   HDL >39 mg/dL  54  51  59  50   Calc LDL 0 - 99 mg/dL  564  332  951  884   Triglycerides 0 - 149 mg/dL  166  82  063  016   Creatinine 0.44 - 1.00 mg/dL 0.10  9.32  3.55  7.32  0.61       03/13/2023   11:11 AM 04/28/2022   10:01 AM 04/28/2022    8:48 AM 03/13/2022    4:36 PM 12/30/2021    3:52 PM 12/30/2021    3:41 PM 11/16/2021    9:01 AM  BP/Weight  Systolic BP 126 105 107  110 115 158  Diastolic BP 80 62 78  70 69 90  Wt. (Lbs) 159.12  165 163  163.12   BMI 29.1 kg/m2  29.23 kg/m2 28.87 kg/m2  29.84 kg/m2        No data to display          Updated lab needed at/ before next visit.   Overweight (BMI 25.0-29.9)  Patient re-educated about  the importance of commitment to a  minimum of 150 minutes of exercise per week as able.  The importance of healthy food choices with portion control discussed, as well as eating regularly and within a 12 hour window most days. The need to choose "clean , green" food 50 to 75% of the time is discussed, as well as to make water the primary drink and set a goal of 64 ounces water daily.       03/13/2023   11:11 AM 04/28/2022    8:48 AM 03/13/2022    4:36 PM  Weight /BMI  Weight 159 lb 1.9 oz 165 lb 163 lb  Height 5\' 2"  (1.575 m) 5\' 3"  (1.6 m) 5\' 3"  (1.6 m)  BMI 29.1 kg/m2 29.23 kg/m2 28.87 kg/m2    Improved  Low serum vitamin D Updated lab needed at/ before next visit.   Right hand pain Increased pain and tingling following recent injury, Ortho to further eval, does have C spine disease  Seasonal  allergies Uncontolled currently, commit t daily singulair and flonase spray

## 2023-03-13 NOTE — Assessment & Plan Note (Signed)
Updated lab needed at/ before next visit.   

## 2023-03-13 NOTE — Assessment & Plan Note (Signed)
Increased pain and tingling following recent injury, Ortho to further eval, does have C spine disease

## 2023-03-13 NOTE — Assessment & Plan Note (Signed)
Patient educated about the importance of limiting  Carbohydrate intake , the need to commit to daily physical activity for a minimum of 30 minutes , and to commit weight loss. The fact that changes in all these areas will reduce or eliminate all together the development of diabetes is stressed.      Latest Ref Rng & Units 04/25/2022    9:50 AM 06/21/2021    8:01 AM 12/23/2016    9:52 AM 11/27/2015    9:47 AM 12/02/2014    9:38 AM  Diabetic Labs  HbA1c <5.7 %   5.6  5.6  5.8   Chol 100 - 199 mg/dL  629  528  413  244   HDL >39 mg/dL  54  51  59  50   Calc LDL 0 - 99 mg/dL  010  272  536  644   Triglycerides 0 - 149 mg/dL  034  82  742  595   Creatinine 0.44 - 1.00 mg/dL 6.38  7.56  4.33  2.95  0.61       03/13/2023   11:11 AM 04/28/2022   10:01 AM 04/28/2022    8:48 AM 03/13/2022    4:36 PM 12/30/2021    3:52 PM 12/30/2021    3:41 PM 11/16/2021    9:01 AM  BP/Weight  Systolic BP 126 105 107  110 115 158  Diastolic BP 80 62 78  70 69 90  Wt. (Lbs) 159.12  165 163  163.12   BMI 29.1 kg/m2  29.23 kg/m2 28.87 kg/m2  29.84 kg/m2        No data to display          Updated lab needed at/ before next visit.

## 2023-03-13 NOTE — Assessment & Plan Note (Signed)
Asked:confirms currently smokes cigarettes °Assess: Unwilling to set a quit date, but is cutting back °Advise: needs to QUIT to reduce risk of cancer, cardio and cerebrovascular disease °Assist: counseled for 5 minutes and literature provided °Arrange: follow up in 2 to 4 months ° °

## 2023-03-13 NOTE — Assessment & Plan Note (Signed)
Uncontolled currently, commit t daily singulair and flonase spray

## 2023-03-13 NOTE — Patient Instructions (Addendum)
Annual exam end  February,v callif you need me sooner  You are referred to Ortho re right shoulder pain following injury and right carpal tunnel synd   Please sched mammogram at checkout due 2/2/or after  Fasting labs over due , pls do this week, CBC, lipid, cmp andeGFR, TSH and vit D and HBA1C  Allergy meds are prescribed , montelukast and daily flonase spray  Thanks for choosing [facility], we consider it a privelige to serve you.

## 2023-03-13 NOTE — Assessment & Plan Note (Signed)
Reports increased pain and numbness in right hand following acute injury , Ortho to further evaluate

## 2023-03-13 NOTE — Assessment & Plan Note (Signed)
  Patient re-educated about  the importance of commitment to a  minimum of 150 minutes of exercise per week as able.  The importance of healthy food choices with portion control discussed, as well as eating regularly and within a 12 hour window most days. The need to choose "clean , green" food 50 to 75% of the time is discussed, as well as to make water the primary drink and set a goal of 64 ounces water daily.       03/13/2023   11:11 AM 04/28/2022    8:48 AM 03/13/2022    4:36 PM  Weight /BMI  Weight 159 lb 1.9 oz 165 lb 163 lb  Height 5\' 2"  (1.575 m) 5\' 3"  (1.6 m) 5\' 3"  (1.6 m)  BMI 29.1 kg/m2 29.23 kg/m2 28.87 kg/m2    Improved

## 2023-03-16 ENCOUNTER — Ambulatory Visit: Payer: BC Managed Care – PPO | Admitting: Physician Assistant

## 2023-03-19 ENCOUNTER — Other Ambulatory Visit: Payer: Self-pay

## 2023-03-19 ENCOUNTER — Encounter: Payer: Self-pay | Admitting: Family Medicine

## 2023-03-19 DIAGNOSIS — R059 Cough, unspecified: Secondary | ICD-10-CM

## 2023-03-20 ENCOUNTER — Telehealth: Payer: Self-pay | Admitting: Family Medicine

## 2023-03-20 ENCOUNTER — Other Ambulatory Visit: Payer: Self-pay

## 2023-03-20 ENCOUNTER — Other Ambulatory Visit (HOSPITAL_COMMUNITY)
Admission: RE | Admit: 2023-03-20 | Discharge: 2023-03-20 | Disposition: A | Discharge status: Home / Self Care | Payer: BC Managed Care – PPO | Source: Ambulatory Visit | Attending: Family Medicine | Admitting: Family Medicine

## 2023-03-20 ENCOUNTER — Ambulatory Visit (HOSPITAL_COMMUNITY)
Admission: RE | Admit: 2023-03-20 | Discharge: 2023-03-20 | Disposition: A | Discharge status: Home / Self Care | Payer: BC Managed Care – PPO | Source: Ambulatory Visit | Attending: Family Medicine | Admitting: Family Medicine

## 2023-03-20 DIAGNOSIS — R7303 Prediabetes: Secondary | ICD-10-CM

## 2023-03-20 DIAGNOSIS — D539 Nutritional anemia, unspecified: Secondary | ICD-10-CM

## 2023-03-20 DIAGNOSIS — I10 Essential (primary) hypertension: Secondary | ICD-10-CM | POA: Insufficient documentation

## 2023-03-20 DIAGNOSIS — R059 Cough, unspecified: Secondary | ICD-10-CM | POA: Insufficient documentation

## 2023-03-20 LAB — COMPREHENSIVE METABOLIC PANEL
ALT: 23 U/L (ref 0–44)
AST: 20 U/L (ref 15–41)
Albumin: 4 g/dL (ref 3.5–5.0)
Alkaline Phosphatase: 75 U/L (ref 38–126)
Anion gap: 12 (ref 5–15)
BUN: 14 mg/dL (ref 6–20)
CO2: 23 mmol/L (ref 22–32)
Calcium: 9.5 mg/dL (ref 8.9–10.3)
Chloride: 101 mmol/L (ref 98–111)
Creatinine, Ser: 0.67 mg/dL (ref 0.44–1.00)
GFR, Estimated: >60 mL/min (ref >60)
Glucose, Bld: 103 mg/dL — ABNORMAL HIGH (ref 70–99)
Potassium: 3.6 mmol/L (ref 3.5–5.1)
Sodium: 136 mmol/L (ref 135–145)
Total Bilirubin: 0.6 mg/dL (ref <1.2)
Total Protein: 7.5 g/dL (ref 6.5–8.1)

## 2023-03-20 LAB — LIPID PANEL
Cholesterol: 213 mg/dL — ABNORMAL HIGH (ref 0–200)
HDL: 46 mg/dL (ref >40)
LDL Cholesterol: 149 mg/dL — ABNORMAL HIGH (ref 0–99)
Total CHOL/HDL Ratio: 4.6 {ratio}
Triglycerides: 89 mg/dL (ref <150)
VLDL: 18 mg/dL (ref 0–40)

## 2023-03-20 LAB — CBC
HCT: 42.9 % (ref 36.0–46.0)
Hemoglobin: 13.8 g/dL (ref 12.0–15.0)
MCH: 28.5 pg (ref 26.0–34.0)
MCHC: 32.2 g/dL (ref 30.0–36.0)
MCV: 88.6 fL (ref 80.0–100.0)
Platelets: 220 10*3/uL (ref 150–400)
RBC: 4.84 MIL/uL (ref 3.87–5.11)
RDW: 12.4 % (ref 11.5–15.5)
WBC: 10.1 10*3/uL (ref 4.0–10.5)
nRBC: 0 % (ref 0.0–0.2)

## 2023-03-20 LAB — HEMOGLOBIN A1C
Hgb A1c MFr Bld: 6.2 % — ABNORMAL HIGH (ref 4.8–5.6)
Mean Plasma Glucose: 131.24 mg/dL

## 2023-03-20 LAB — VITAMIN D 25 HYDROXY (VIT D DEFICIENCY, FRACTURES): Vit D, 25-Hydroxy: 46.32 ng/mL (ref 30–100)

## 2023-03-20 LAB — TSH: TSH: 0.22 u[IU]/mL — ABNORMAL LOW (ref 0.350–4.500)

## 2023-03-20 LAB — T4, FREE: Free T4: 0.94 ng/dL (ref 0.61–1.12)

## 2023-03-20 NOTE — Telephone Encounter (Signed)
Order changed to stat

## 2023-03-20 NOTE — Telephone Encounter (Signed)
Patient came by our office, just had xray done at [facility] of chest the radiologist asked her to come by our office to let provider know to change to STAT.

## 2023-03-21 ENCOUNTER — Ambulatory Visit: Payer: BC Managed Care – PPO | Admitting: Orthopaedic Surgery

## 2023-03-22 ENCOUNTER — Encounter: Payer: Self-pay | Admitting: Orthopaedic Surgery

## 2023-03-22 ENCOUNTER — Ambulatory Visit (INDEPENDENT_AMBULATORY_CARE_PROVIDER_SITE_OTHER): Payer: Self-pay | Admitting: Orthopaedic Surgery

## 2023-03-22 VITALS — Ht 63.0 in | Wt 159.0 lb

## 2023-03-22 DIAGNOSIS — M25511 Pain in right shoulder: Secondary | ICD-10-CM

## 2023-03-22 NOTE — Progress Notes (Signed)
Office Visit Note   Patient: Kathy Stewart           Date of Birth: May 19, 1970           MRN: [id number] Visit Date: 03/22/2023              Requested by: Kerri Perches, MD [address] [place],  [place] [address] PCP: Kerri Perches, MD   Assessment & Plan: Visit Diagnoses:  1. Acute pain of right shoulder     Plan: Work note given she could do regular work right arm avoiding pulling or overhead work with the right arm x 4 weeks now recheck in 4 weeks.  She does not continue to improve we can consider diagnostic imaging studies but currently she has improved each week.    Follow-Up Instructions: Return in about 4 weeks (around 04/19/2023).   Orders:  No orders of the defined types were placed in this encounter.  No orders of the defined types were placed in this encounter.     Procedures: No procedures performed   Clinical Data: No additional findings.   Subjective: Chief Complaint  Patient presents with   Right Shoulder - Pain    OTJI 03/02/2023    HPI 52 year old female on-the-job injury left/1/24 when she was trying to move a table at work she initially tried to push using her hips leaning into it it was unsuccessful.  She tried to pull the table with her shoulders as she was pulling hard on it she felt sudden sharp pain in her right shoulder.  States it hurts with outstretched and overhead activities does not bother her if she is still and not using her hand.  She is right-hand dominant.  She is use Tylenol and ibuprofen.  No past history of shoulder injury or shoulder surgery in the past.  She does have some evidence of cervical disc problems with MRI scan 2017 with mild stenosis C4-5 and spurring C4-C7.  Patient denies any numbness or tingling in her hands currently.  Since her injury she thinks her shoulder has gotten some improvement.  She has no pain when she is stationary.  Plain radiograph showed tiny spot calcification at the  supraspinatus insertion site.  No glenohumeral arthritis.  Review of Systems history of hypertension carpal tunnel syndrome, prediabetes.  All systems are noncontributory to HPI.   Objective: Vital Signs: Ht 5\' 3"  (1.6 m)   Wt 159 lb (72.1 kg)   LMP 10/17/2012   BMI 28.17 kg/m   Physical Exam Constitutional:      Appearance: She is well-developed.  HENT:     Head: Normocephalic.     Right Ear: External ear normal.     Left Ear: External ear normal. There is no impacted cerumen.  Eyes:     Pupils: Pupils are equal, round, and reactive to light.  Neck:     Thyroid: No thyromegaly.     Trachea: No tracheal deviation.  Cardiovascular:     Rate and Rhythm: Normal rate.  Pulmonary:     Effort: Pulmonary effort is normal.  Abdominal:     Palpations: Abdomen is soft.  Musculoskeletal:     Cervical back: No rigidity.  Skin:    General: Skin is warm and dry.  Neurological:     Mental Status: She is alert and oriented to person, place, and time.  Psychiatric:        Behavior: Behavior normal.     Ortho Exam positive  impingement right shoulder negative drop arm test.  Acromioclavicular joint is normal.  She has some brachioplexus tenderness both right and left.  Subscap testing is strong.  Specialty Comments:  No specialty comments available.  Imaging: No results found.   PMFS History: Patient Active Problem List   Diagnosis Date Noted   Acute pain of right shoulder 03/13/2023   Carpal tunnel syndrome, right 03/13/2023   Low serum vitamin D 03/13/2023   Right hand pain 03/13/2023   COVID-19 06/02/2022   Annual visit for general adult medical examination with abnormal findings 11/16/2021   Palpitations 11/16/2021   Skin lesion 11/16/2021   Hyperlipidemia LDL goal <130 12/05/2015   Anemia, deficiency 11/26/2015   Cervical neck pain with evidence of disc disease 06/24/2015   Prediabetes 08/11/2014   Overweight (BMI 25.0-29.9) 08/11/2014   Essential hypertension  08/05/2014   Seasonal allergies 08/05/2014   NICOTINE ADDICTION 05/21/2007   Past Medical History:  Diagnosis Date   Allergy 1990   spring allergies   Anemia    Hypertension 01/2014   Nicotine dependence 1997   current in 2016 approx 5 per day    Family History  Problem Relation Age of Onset   Hypertension Mother    Kidney disease Mother    Cancer Father        bladder   Hypertension Maternal Grandmother    Diabetes Maternal Grandmother    Cataracts Maternal Grandmother    Breast cancer Paternal Grandmother     Past Surgical History:  Procedure Laterality Date   ABDOMINAL HYSTERECTOMY  11/2012   fibroids, has one ovary   BILATERAL SALPINGECTOMY Bilateral 12/17/2012   Procedure: BILATERAL SALPINGECTOMY;  Surgeon: Tilda Burrow, MD;  Location: [facility];  Service: Gynecology;  Laterality: Bilateral;   CESAREAN SECTION  1992   COLONOSCOPY WITH PROPOFOL N/A 04/28/2022   Procedure: COLONOSCOPY WITH PROPOFOL;  Surgeon: Lanelle Bal, DO;  Location: [facility];  Service: Endoscopy;  Laterality: N/A;  10:00am, asa 2   POLYPECTOMY  04/28/2022   Procedure: POLYPECTOMY;  Surgeon: Lanelle Bal, DO;  Location: [facility];  Service: Endoscopy;;   SALPINGOOPHORECTOMY Left 12/17/2012   Procedure: SALPINGO OOPHORECTOMY;  Surgeon: Tilda Burrow, MD;  Location: [facility];  Service: Gynecology;  Laterality: Left;   SUPRACERVICAL ABDOMINAL HYSTERECTOMY N/A 12/17/2012   Procedure: ABDOMINAL SUPRACERVICAL HYSTERECTOMY;  Surgeon: Tilda Burrow, MD;  Location: [facility];  Service: Gynecology;  Laterality: N/A;   TONSILLECTOMY     Social History   Occupational History   Not on file  Tobacco Use   Smoking status: Every Day    Current packs/day: 0.25    Average packs/day: 0.3 packs/day for 17.0 years (4.3 ttl pk-yrs)    Types: Cigarettes   Smokeless tobacco: Never  Vaping Use   Vaping status: Never Used  Substance and Sexual Activity   Alcohol use: Yes    Comment: social   Drug  use: No   Sexual activity: Never

## 2023-03-26 ENCOUNTER — Telehealth: Payer: Self-pay | Admitting: Radiology

## 2023-03-26 NOTE — Telephone Encounter (Signed)
Please see message from [place] office below and advise.   She called and wants to know if she can get a work note to be out for a few days due to her shoulder pain.  She feels like if she can give it a rest hopefully it will be better.  Her number is [phone].

## 2023-03-26 NOTE — Telephone Encounter (Signed)
I called patient. Note entered for out of work x 4 shifts. She will pick up in [place] office.

## 2023-04-03 LAB — T3, FREE

## 2023-04-04 ENCOUNTER — Other Ambulatory Visit: Payer: Self-pay

## 2023-04-04 DIAGNOSIS — D539 Nutritional anemia, unspecified: Secondary | ICD-10-CM

## 2023-04-19 ENCOUNTER — Ambulatory Visit: Payer: BC Managed Care – PPO | Admitting: Orthopaedic Surgery

## 2023-04-26 ENCOUNTER — Ambulatory Visit (INDEPENDENT_AMBULATORY_CARE_PROVIDER_SITE_OTHER): Payer: BC Managed Care – PPO | Admitting: Orthopaedic Surgery

## 2023-04-26 ENCOUNTER — Encounter: Payer: Self-pay | Admitting: Orthopaedic Surgery

## 2023-04-26 VITALS — Ht 63.0 in | Wt 159.0 lb

## 2023-04-26 DIAGNOSIS — M5090 Cervical disc disorder, unspecified, unspecified cervical region: Secondary | ICD-10-CM

## 2023-04-26 DIAGNOSIS — G5601 Carpal tunnel syndrome, right upper limb: Secondary | ICD-10-CM

## 2023-04-26 NOTE — Progress Notes (Signed)
Office Visit Note   Patient: Kathy Stewart           Date of Birth: 1971-04-07           MRN: [id number] Visit Date: 04/26/2023              Requested by: Kerri Perches, MD [address] [place],  [place] [address] PCP: Kerri Perches, MD   Assessment & Plan: Visit Diagnoses:  1. Cervical neck pain with evidence of disc disease   2. Carpal tunnel syndrome, right upper limb     Plan: Work slip given stating she is okay to do regular work activity.  At some point she might have increased carpal tunnel complaints and can call if she would like to have the right carpal tunnel surgery scheduled.  This is pre-existing and present prior to her on-the-job injury in November 2024.  No impairment signed for the November injury patients at [facility] she can follow-up with me on a as needed basis.  Follow-Up Instructions: No follow-ups on file.   Orders:  No orders of the defined types were placed in this encounter.  No orders of the defined types were placed in this encounter.     Procedures: No procedures performed   Clinical Data: No additional findings.   Subjective: Chief Complaint  Patient presents with   Right Shoulder - Pain, Follow-up    OTJI 03/02/2023    HPI follow-up on-the-job injury 03/02/2023 right shoulder.  She does have some cervical spondylosis seen on MRI scan 2017 cervical spine.  History of carpal tunnel still wears her night splints.  She has been on modified duty gradually resuming closer to her normal task and states she is ready to get a work note for just regular work activities.  She denies myelopathic symptoms.  Carpal tunnel symptoms are on the right hand not on the left electrical study was years ago but done by Dr. Lynne Leader.  No myelopathic problems.  Review of Systems positive for prediabetes.   Objective: Vital Signs: Ht 5\' 3"  (1.6 m)   Wt 159 lb (72.1 kg)   LMP 10/17/2012   BMI 28.17 kg/m   Physical Exam Constitutional:       Appearance: She is well-developed.  HENT:     Head: Normocephalic.     Right Ear: External ear normal.     Left Ear: External ear normal. There is no impacted cerumen.  Eyes:     Pupils: Pupils are equal, round, and reactive to light.  Neck:     Thyroid: No thyromegaly.     Trachea: No tracheal deviation.  Cardiovascular:     Rate and Rhythm: Normal rate.  Pulmonary:     Effort: Pulmonary effort is normal.  Abdominal:     Palpations: Abdomen is soft.  Musculoskeletal:     Cervical back: No rigidity.  Skin:    General: Skin is warm and dry.  Neurological:     Mental Status: She is alert and oriented to person, place, and time.  Psychiatric:        Behavior: Behavior normal.     Ortho Exam no thenar atrophy.  Normal gait.  Specialty Comments:  No specialty comments available.  Imaging: No results found.   PMFS History: Patient Active Problem List   Diagnosis Date Noted   Carpal tunnel syndrome, right upper limb 04/26/2023   Acute pain of right shoulder 03/13/2023   Carpal tunnel syndrome, right 03/13/2023  Low serum vitamin D 03/13/2023   Right hand pain 03/13/2023   COVID-19 06/02/2022   Annual visit for general adult medical examination with abnormal findings 11/16/2021   Palpitations 11/16/2021   Skin lesion 11/16/2021   Hyperlipidemia LDL goal <130 12/05/2015   Anemia, deficiency 11/26/2015   Cervical neck pain with evidence of disc disease 06/24/2015   Prediabetes 08/11/2014   Overweight (BMI 25.0-29.9) 08/11/2014   Essential hypertension 08/05/2014   Seasonal allergies 08/05/2014   NICOTINE ADDICTION 05/21/2007   Past Medical History:  Diagnosis Date   Allergy 1990   spring allergies   Anemia    Hypertension 01/2014   Nicotine dependence 1997   current in 2016 approx 5 per day    Family History  Problem Relation Age of Onset   Hypertension Mother    Kidney disease Mother    Cancer Father        bladder   Hypertension Maternal  Grandmother    Diabetes Maternal Grandmother    Cataracts Maternal Grandmother    Breast cancer Paternal Grandmother     Past Surgical History:  Procedure Laterality Date   ABDOMINAL HYSTERECTOMY  11/2012   fibroids, has one ovary   BILATERAL SALPINGECTOMY Bilateral 12/17/2012   Procedure: BILATERAL SALPINGECTOMY;  Surgeon: Tilda Burrow, MD;  Location: [facility];  Service: Gynecology;  Laterality: Bilateral;   CESAREAN SECTION  1992   COLONOSCOPY WITH PROPOFOL N/A 04/28/2022   Procedure: COLONOSCOPY WITH PROPOFOL;  Surgeon: Lanelle Bal, DO;  [facility];  Service: Endoscopy;  Laterality: N/A;  10:00am, asa 2   POLYPECTOMY  04/28/2022   Procedure: POLYPECTOMY;  Surgeon: Lanelle Bal, DO;  [facility];  Service: Endoscopy;;   SALPINGOOPHORECTOMY Left 12/17/2012   Procedure: SALPINGO OOPHORECTOMY;  Surgeon: Tilda Burrow, MD;  Location: [facility];  Service: Gynecology;  Laterality: Left;   SUPRACERVICAL ABDOMINAL HYSTERECTOMY N/A 12/17/2012   Procedure: ABDOMINAL SUPRACERVICAL HYSTERECTOMY;  Surgeon: Tilda Burrow, MD;  Location: [facility];  Service: Gynecology;  Laterality: N/A;   TONSILLECTOMY     Social History   Occupational History   Not on file  Tobacco Use   Smoking status: Every Day    Current packs/day: 0.25    Average packs/day: 0.3 packs/day for 17.0 years (4.3 ttl pk-yrs)    Types: Cigarettes   Smokeless tobacco: Never  Vaping Use   Vaping status: Never Used  Substance and Sexual Activity   Alcohol use: Yes    Comment: social   Drug use: No   Sexual activity: Never

## 2023-05-08 ENCOUNTER — Other Ambulatory Visit: Payer: Self-pay | Admitting: Family Medicine

## 2023-05-15 ENCOUNTER — Other Ambulatory Visit: Payer: Self-pay | Admitting: Family Medicine

## 2023-05-28 ENCOUNTER — Other Ambulatory Visit: Payer: Self-pay | Admitting: Family Medicine

## 2023-05-29 NOTE — Telephone Encounter (Signed)
Copied from CRM [id number]. Topic: Medical Record Request - Records Request >> May 29, 2023 12:36 PM Carloyn Manner C wrote: Reason for CRM: Patient came into the office 2 weeks ago to provide a physical medical release form to be completed and fax to Dr. Beryle Beams. She is following up with the request. Can you please reach out to her regarding this. Thank you.

## 2023-06-05 ENCOUNTER — Encounter: Payer: Self-pay | Admitting: Family Medicine

## 2023-06-05 DIAGNOSIS — G5600 Carpal tunnel syndrome, unspecified upper limb: Secondary | ICD-10-CM

## 2023-06-28 ENCOUNTER — Ambulatory Visit (INDEPENDENT_AMBULATORY_CARE_PROVIDER_SITE_OTHER): Payer: 59 | Admitting: Family Medicine

## 2023-06-28 ENCOUNTER — Encounter: Payer: Self-pay | Admitting: Family Medicine

## 2023-06-28 VITALS — BP 123/74 | HR 71 | Ht 63.0 in | Wt 159.0 lb

## 2023-06-28 DIAGNOSIS — E785 Hyperlipidemia, unspecified: Secondary | ICD-10-CM

## 2023-06-28 DIAGNOSIS — R7303 Prediabetes: Secondary | ICD-10-CM | POA: Diagnosis not present

## 2023-06-28 DIAGNOSIS — R7989 Other specified abnormal findings of blood chemistry: Secondary | ICD-10-CM

## 2023-06-28 DIAGNOSIS — I10 Essential (primary) hypertension: Secondary | ICD-10-CM

## 2023-06-28 DIAGNOSIS — Z0001 Encounter for general adult medical examination with abnormal findings: Secondary | ICD-10-CM

## 2023-06-28 DIAGNOSIS — F17200 Nicotine dependence, unspecified, uncomplicated: Secondary | ICD-10-CM

## 2023-06-28 DIAGNOSIS — Z1231 Encounter for screening mammogram for malignant neoplasm of breast: Secondary | ICD-10-CM

## 2023-06-28 NOTE — Assessment & Plan Note (Signed)
 Controlled, no change in medication DASH diet and commitment to daily physical activity for a minimum of 30 minutes discussed and encouraged, as a part of hypertension management. The importance of attaining a healthy weight is also discussed.     06/28/2023    8:28 AM 04/26/2023    9:45 AM 03/22/2023    9:04 AM 03/13/2023   11:11 AM 04/28/2022   10:01 AM 04/28/2022    8:48 AM 03/13/2022    4:36 PM  BP/Weight  Systolic BP 123   126 105 107   Diastolic BP 74   80 62 78   Wt. (Lbs) 159 159 159 159.12  165 163  BMI 28.17 kg/m2 28.17 kg/m2 28.17 kg/m2 29.1 kg/m2  29.23 kg/m2 28.87 kg/m2

## 2023-06-28 NOTE — Patient Instructions (Addendum)
 Follow-up in 5 months, call if you need me sooner.  Please schedule mammogram at checkout.  You are referred for lung cancer screening based on your current smoking history.  Please get all the help that you can to quit smoking since you do realize this is a serious health hazard and you do want to stop smoking.  Yes it is challenging hence get all the help you can  Labs today checking on cholesterol thyroid function and blood sugar in particular.  A message will be sent to you.  As discussed if your thyroid function test is again abnormal you will be referred to a specialist for further evaluation.  Please work on reducing the amount of beer that you drink.  Thanks for choosing [facility], we consider it a privelige to serve you.

## 2023-06-28 NOTE — Assessment & Plan Note (Signed)
 Updated lab needed at/ before next visit.

## 2023-06-28 NOTE — Assessment & Plan Note (Signed)
 Hyperlipidemia:Low fat diet discussed and encouraged.   Lipid Panel  Lab Results  Component Value Date   CHOL 213 (H) 03/20/2023   HDL 46 03/20/2023   LDLCALC 149 (H) 03/20/2023   TRIG 89 03/20/2023   CHOLHDL 4.6 03/20/2023     Updated lab needed at/ before next visit.

## 2023-06-28 NOTE — Assessment & Plan Note (Signed)
 Asked:confirms currently smokes cigarettes 10/day Assess: Unwilling to set a quit date, but is cutting back Advise: needs to QUIT to reduce risk of cancer, cardio and cerebrovascular disease Assist: counseled for 5 minutes and literature provided Arrange: follow up in 2 to 4 months Refer for screening reports an over 20 pack year history

## 2023-06-28 NOTE — Assessment & Plan Note (Signed)
 Patient educated about the importance of limiting  Carbohydrate intake , the need to commit to daily physical activity for a minimum of 30 minutes , and to commit weight loss. The fact that changes in all these areas will reduce or eliminate all together the development of diabetes is stressed.      Latest Ref Rng & Units 03/20/2023    9:58 AM 03/20/2023    9:57 AM 04/25/2022    9:50 AM 06/21/2021    8:01 AM 12/23/2016    9:52 AM  Diabetic Labs  HbA1c 4.8 - 5.6 % 6.2     5.6   Chol 0 - 200 mg/dL  604   540  981   HDL >19 mg/dL  46   54  51   Calc LDL 0 - 99 mg/dL  147   829  562   Triglycerides <150 mg/dL  89   130  82   Creatinine 0.44 - 1.00 mg/dL  8.65  7.84  6.96  2.95       06/28/2023    8:28 AM 04/26/2023    9:45 AM 03/22/2023    9:04 AM 03/13/2023   11:11 AM 04/28/2022   10:01 AM 04/28/2022    8:48 AM 03/13/2022    4:36 PM  BP/Weight  Systolic BP 123   126 105 107   Diastolic BP 74   80 62 78   Wt. (Lbs) 159 159 159 159.12  165 163  BMI 28.17 kg/m2 28.17 kg/m2 28.17 kg/m2 29.1 kg/m2  29.23 kg/m2 28.87 kg/m2       No data to display          Updated lab needed at/ before next visit.

## 2023-06-28 NOTE — Progress Notes (Unsigned)
 Kathy Stewart     MRN: [id number]      DOB: [birth year]  Chief Complaint  Patient presents with   Annual Exam    HPI: Patient is in for annual physical exam. Smoking and beer intake are discussed , needs to stop smoking and reduce beer Immunization is reviewed , and  updated if needed.   PE: BP 123/74   Pulse 71   Ht 5\' 3"  (1.6 m)   Wt 159 lb (72.1 kg)   LMP 10/17/2012   SpO2 97%   BMI 28.17 kg/m   Pleasant  female, alert and oriented x 3, in no cardio-pulmonary distress. Afebrile. HEENT No facial trauma or asymetry. Sinuses non tender.  Extra occullar muscles intact.. External ears normal, . Neck: supple, no adenopathy,JVD or thyromegaly.No bruits.  Chest: Clear to ascultation bilaterally.No crackles or wheezes. Non tender to palpation  Breast: No asymetry,no masses or lumps. No tenderness. No nipple discharge or inversion. No axillary or supraclavicular adenopathy  Cardiovascular system; Heart sounds normal,  S1 and  S2 ,no S3.  No murmur, or thrill. Apical beat not displaced Peripheral pulses normal.  Abdomen: Soft, non tender, no organomegaly or masses. No bruits. Bowel sounds normal. No guarding, tenderness or rebound.   GU: Asymptomatic not examined  Musculoskeletal exam: Full ROM of spine, hips , shoulders and knees. No deformity ,swelling or crepitus noted. No muscle wasting or atrophy.   Neurologic: Cranial nerves 2 to 12 intact. Power, tone ,sensation and reflexes normal throughout. No disturbance in gait. No tremor.  Skin: Intact, no ulceration, erythema , scaling or rash noted. Pigmentation normal throughout  Psych; Normal mood and affect. Judgement and concentration normal   Assessment & Plan:  Prediabetes Patient educated about the importance of limiting  Carbohydrate intake , the need to commit to daily physical activity for a minimum of 30 minutes , and to commit weight loss. The fact that changes in all these areas  will reduce or eliminate all together the development of diabetes is stressed.      Latest Ref Rng & Units 03/20/2023    9:58 AM 03/20/2023    9:57 AM 04/25/2022    9:50 AM 06/21/2021    8:01 AM 12/23/2016    9:52 AM  Diabetic Labs  HbA1c 4.8 - 5.6 % 6.2     5.6   Chol 0 - 200 mg/dL  284   132  440   HDL >10 mg/dL  46   54  51   Calc LDL 0 - 99 mg/dL  272   536  644   Triglycerides <150 mg/dL  89   034  82   Creatinine 0.44 - 1.00 mg/dL  7.42  5.95  6.38  7.56       06/28/2023    8:28 AM 04/26/2023    9:45 AM 03/22/2023    9:04 AM 03/13/2023   11:11 AM 04/28/2022   10:01 AM 04/28/2022    8:48 AM 03/13/2022    4:36 PM  BP/Weight  Systolic BP 123   126 105 107   Diastolic BP 74   80 62 78   Wt. (Lbs) 159 159 159 159.12  165 163  BMI 28.17 kg/m2 28.17 kg/m2 28.17 kg/m2 29.1 kg/m2  29.23 kg/m2 28.87 kg/m2       No data to display          Updated lab needed at/ before next visit.   NICOTINE ADDICTION Asked:confirms currently smokes cigarettes 10/day  Assess: Unwilling to set a quit date, but is cutting back Advise: needs to QUIT to reduce risk of cancer, cardio and cerebrovascular disease Assist: counseled for 5 minutes and literature provided Arrange: follow up in 2 to 4 months Refer for screening reports an over 20 pack year history  Abnormal TSH Updated lab needed at/ before next visit.   Hyperlipidemia LDL goal <130 Hyperlipidemia:Low fat diet discussed and encouraged.   Lipid Panel  Lab Results  Component Value Date   CHOL 213 (H) 03/20/2023   HDL 46 03/20/2023   LDLCALC 149 (H) 03/20/2023   TRIG 89 03/20/2023   CHOLHDL 4.6 03/20/2023     Updated lab needed at/ before next visit.   Essential hypertension Controlled, no change in medication DASH diet and commitment to daily physical activity for a minimum of 30 minutes discussed and encouraged, as a part of hypertension management. The importance of attaining a healthy weight is also  discussed.     06/28/2023    8:28 AM 04/26/2023    9:45 AM 03/22/2023    9:04 AM 03/13/2023   11:11 AM 04/28/2022   10:01 AM 04/28/2022    8:48 AM 03/13/2022    4:36 PM  BP/Weight  Systolic BP 123   126 105 107   Diastolic BP 74   80 62 78   Wt. (Lbs) 159 159 159 159.12  165 163  BMI 28.17 kg/m2 28.17 kg/m2 28.17 kg/m2 29.1 kg/m2  29.23 kg/m2 28.87 kg/m2       Annual visit for general adult medical examination with abnormal findings Annual exam as documented. Counseling done  re healthy lifestyle involving commitment to 150 minutes exercise per week, heart healthy diet, and attaining healthy weight.The importance of adequate sleep also discussed. Regular seat belt use and home safety, is also discussed. Changes in health habits are decided on by the patient with goals and time frames  set for achieving them. Immunization and cancer screening needs are specifically addressed at this visit.

## 2023-06-29 NOTE — Assessment & Plan Note (Signed)

## 2023-07-06 ENCOUNTER — Telehealth: Payer: Self-pay | Admitting: Family Medicine

## 2023-07-06 NOTE — Telephone Encounter (Signed)
 Patient said she is having surgery soon and will need before that- can call if she doesn't answer LVM. Thanks

## 2023-07-06 NOTE — Telephone Encounter (Signed)
 Copied from CRM [id number]. Topic: Medical Record Request - Other >> Jul 06, 2023 11:18 AM Kathy Stewart wrote: Reason for CRM: Patient is calling to get an update on her medical records. Patient requested to have her records sent to Dr. Beryle Beams about a month ago. Can you please contact the  patient before 12 pm today? Patient has a neurology  appointment today at 2 pm with Dr. Gerilyn Pilgrim.

## 2023-07-09 NOTE — Telephone Encounter (Signed)
 Contacted pt. She stated that  Dr. Gerilyn Pilgrim is looking for a nerve conduction study that had to do with her Carpal tunnel. He was the one who placed the order but his office since moving no longer has record of the procedure. The record is not in her file w/ this office. To help the pt. MA gave her the number to [facility] medical records at [phone] for further assistance .

## 2023-07-18 ENCOUNTER — Telehealth: Payer: Self-pay | Admitting: Radiology

## 2023-07-18 NOTE — Telephone Encounter (Signed)
 Patient called Kathy Stewart office and states that she is receiving bills from when she was treated her for a work comp injury. Can you please advise?  CB for patient is [phone]

## 2023-07-19 NOTE — Telephone Encounter (Signed)
 noted

## 2023-07-25 ENCOUNTER — Other Ambulatory Visit: Payer: Self-pay

## 2023-07-25 ENCOUNTER — Telehealth: Payer: Self-pay | Admitting: Acute Care

## 2023-07-25 DIAGNOSIS — Z122 Encounter for screening for malignant neoplasm of respiratory organs: Secondary | ICD-10-CM

## 2023-07-25 DIAGNOSIS — F17210 Nicotine dependence, cigarettes, uncomplicated: Secondary | ICD-10-CM

## 2023-07-25 DIAGNOSIS — Z87891 Personal history of nicotine dependence: Secondary | ICD-10-CM

## 2023-07-25 NOTE — Telephone Encounter (Signed)
 Lung Cancer Screening Narrative/Criteria Questionnaire (Cigarette Smokers Only- No Cigars/Pipes/vapes)   Kathy Stewart   SDMV:08/22/23 at 0945a/[name]                                           1970-12-08                         LDCT: 424/25 at 12 noon/[place]    53 y.o.   Phone: [phone]  Lung Screening Narrative (confirm age 71-77 yrs Medicare / 50-80 yrs Private pay insurance)   Insurance information:[employer]   Referring Provider:[facility]   This screening involves an initial phone call with a team member from our program. It is called a shared decision making visit. The initial meeting is required by insurance and Medicare to make sure you understand the program. This appointment takes about 15-20 minutes to complete. The CT scan will completed at a separate date/time. This scan takes about 5-10 minutes to complete and you may eat and drink before and after the scan.  Criteria questions for Lung Cancer Screening:   Are you a current or former smoker? Current Age began smoking: 53 yo   If you are a former smoker, what year did you quit smoking? NA   To calculate your smoking history, I need an accurate estimate of how many packs of cigarettes you smoked per day and for how many years. (Not just the number of PPD you are now smoking)   Years smoking 27 x Packs per day 1/2 to 1 = Pack years 20   (at least 20 pack yrs)   (Make sure they understand that we need to know how much they have smoked in the past, not just the number of PPD they are smoking now)  Do you have a personal history of cancer?  No    Do you have a family history of cancer? Yes  (cancer type and and relative) father/bladder  Are you coughing up blood?  No  Have you had unexplained weight loss of 15 lbs or more in the last 6 months? 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     Additional information: NA

## 2023-08-14 ENCOUNTER — Other Ambulatory Visit: Payer: Self-pay | Admitting: Family Medicine

## 2023-08-22 ENCOUNTER — Telehealth: Payer: Self-pay | Admitting: Radiology

## 2023-08-22 ENCOUNTER — Encounter: Payer: Self-pay | Admitting: Adult Health

## 2023-08-22 ENCOUNTER — Ambulatory Visit: Admitting: Adult Health

## 2023-08-22 DIAGNOSIS — F17210 Nicotine dependence, cigarettes, uncomplicated: Secondary | ICD-10-CM | POA: Diagnosis not present

## 2023-08-22 NOTE — Progress Notes (Signed)
  Virtual Visit via Telephone Note  I connected with Kathy Stewart , 08/22/23 9:53 AM by a telemedicine application and verified that I am speaking with the correct person using two identifiers.  Location: Patient: home Provider: home   I discussed the limitations of evaluation and management by telemedicine and the availability of in person appointments. The patient expressed understanding and agreed to proceed.   Shared Decision Making Visit Lung Cancer Screening Program [phone])   Eligibility: 53 y.o. Pack Years Smoking History Calculation = 20 pack years  (# packs/per year x # years smoked) Recent History of coughing up blood  no Unexplained weight loss? no ( >Than 15 pounds within the last 6 months ) Prior History Lung / other cancer no (Diagnosis within the last 5 years already requiring surveillance chest CT Scans). Smoking Status Current Smoker  Visit Components: Discussion included one or more decision making aids. YES Discussion included risk/benefits of screening. YES Discussion included potential follow up diagnostic testing for abnormal scans. YES Discussion included meaning and risk of over diagnosis. YES Discussion included meaning and risk of False Positives. YES Discussion included meaning of total radiation exposure. YES  Counseling Included: Importance of adherence to annual lung cancer LDCT screening. YES Impact of comorbidities on ability to participate in the program. YES Ability and willingness to under diagnostic treatment. YES  Smoking Cessation Counseling: Current Smokers:  Discussed importance of smoking cessation. yes Information about tobacco cessation classes and interventions provided to patient. yes Patient provided with "ticket" for LDCT Scan. yes Symptomatic Patient. NO Diagnosis Code: Tobacco Use Z72.0 Asymptomatic Patient yes  Counseling (Intermediate counseling: > three minutes counseling) N6295 (CT Chest Lung Cancer Screening Low  Dose W/O CM) [id number]  Z12.2-Screening of respiratory organs Z87.891-Personal history of nicotine dependence   [place] Dose 08/22/23

## 2023-08-22 NOTE — Patient Instructions (Signed)

## 2023-08-22 NOTE — Telephone Encounter (Signed)
 Please see message from [place] office below.  Kim from [employer] called and said she is the new case Production designer, theatre/television/film for [employer].  Her phone number is [phone].

## 2023-08-23 ENCOUNTER — Ambulatory Visit (HOSPITAL_COMMUNITY)
Admission: RE | Admit: 2023-08-23 | Discharge: 2023-08-23 | Disposition: A | Discharge status: Home / Self Care | Source: Ambulatory Visit | Attending: Acute Care | Admitting: Acute Care

## 2023-08-23 DIAGNOSIS — Z87891 Personal history of nicotine dependence: Secondary | ICD-10-CM | POA: Diagnosis present

## 2023-08-23 DIAGNOSIS — Z122 Encounter for screening for malignant neoplasm of respiratory organs: Secondary | ICD-10-CM | POA: Diagnosis present

## 2023-08-23 DIAGNOSIS — F17210 Nicotine dependence, cigarettes, uncomplicated: Secondary | ICD-10-CM | POA: Insufficient documentation

## 2023-08-26 ENCOUNTER — Other Ambulatory Visit: Payer: Self-pay | Admitting: Family Medicine

## 2023-09-17 ENCOUNTER — Other Ambulatory Visit: Payer: Self-pay | Admitting: Acute Care

## 2023-09-17 DIAGNOSIS — Z87891 Personal history of nicotine dependence: Secondary | ICD-10-CM

## 2023-09-17 DIAGNOSIS — Z122 Encounter for screening for malignant neoplasm of respiratory organs: Secondary | ICD-10-CM

## 2023-09-17 DIAGNOSIS — F17210 Nicotine dependence, cigarettes, uncomplicated: Secondary | ICD-10-CM

## 2023-09-28 ENCOUNTER — Ambulatory Visit: Payer: Self-pay | Admitting: Family Medicine

## 2023-09-28 LAB — CMP14+EGFR
ALT: 14 IU/L (ref 0–32)
AST: 18 IU/L (ref 0–40)
Albumin: 4.5 g/dL (ref 3.8–4.9)
Alkaline Phosphatase: 77 IU/L (ref 44–121)
BUN/Creatinine Ratio: 20 (ref 9–23)
BUN: 12 mg/dL (ref 6–24)
Bilirubin Total: 0.3 mg/dL (ref 0.0–1.2)
CO2: 19 mmol/L — ABNORMAL LOW (ref 20–29)
Calcium: 9.5 mg/dL (ref 8.7–10.2)
Chloride: 105 mmol/L (ref 96–106)
Creatinine, Ser: 0.59 mg/dL (ref 0.57–1.00)
Globulin, Total: 1.9 g/dL (ref 1.5–4.5)
Glucose: 95 mg/dL (ref 70–99)
Potassium: 4.4 mmol/L (ref 3.5–5.2)
Sodium: 140 mmol/L (ref 134–144)
Total Protein: 6.4 g/dL (ref 6.0–8.5)
eGFR: 107 mL/min/{1.73_m2} (ref >59)

## 2023-09-28 LAB — LIPID PANEL W/O CHOL/HDL RATIO
Cholesterol, Total: 209 mg/dL — ABNORMAL HIGH (ref 100–199)
HDL: 52 mg/dL (ref >39)
LDL Chol Calc (NIH): 142 mg/dL — ABNORMAL HIGH (ref 0–99)
Triglycerides: 83 mg/dL (ref 0–149)
VLDL Cholesterol Cal: 15 mg/dL (ref 5–40)

## 2023-09-28 LAB — HEMOGLOBIN A1C
Est. average glucose Bld gHb Est-mCnc: 114 mg/dL
Hgb A1c MFr Bld: 5.6 % (ref 4.8–5.6)

## 2023-09-28 LAB — TSH: TSH: 0.53 u[IU]/mL (ref 0.450–4.500)

## 2023-11-23 ENCOUNTER — Other Ambulatory Visit: Payer: Self-pay | Admitting: Family Medicine

## 2024-01-01 ENCOUNTER — Other Ambulatory Visit: Payer: Self-pay | Admitting: Family Medicine

## 2024-02-01 ENCOUNTER — Other Ambulatory Visit (HOSPITAL_COMMUNITY): Payer: Self-pay | Admitting: Family Medicine

## 2024-02-01 DIAGNOSIS — Z1231 Encounter for screening mammogram for malignant neoplasm of breast: Secondary | ICD-10-CM

## 2024-02-04 ENCOUNTER — Ambulatory Visit (HOSPITAL_COMMUNITY)
Admission: RE | Admit: 2024-02-04 | Discharge: 2024-02-04 | Disposition: A | Discharge status: Home / Self Care | Source: Ambulatory Visit | Attending: Family Medicine | Admitting: Family Medicine

## 2024-02-04 DIAGNOSIS — Z1231 Encounter for screening mammogram for malignant neoplasm of breast: Secondary | ICD-10-CM | POA: Diagnosis present

## 2024-02-15 ENCOUNTER — Ambulatory Visit: Payer: Self-pay

## 2024-02-15 NOTE — Telephone Encounter (Signed)
Mailed normal result letter

## 2024-02-22 ENCOUNTER — Ambulatory Visit: Payer: Self-pay

## 2024-02-22 DIAGNOSIS — Z23 Encounter for immunization: Secondary | ICD-10-CM | POA: Diagnosis not present

## 2024-03-03 ENCOUNTER — Other Ambulatory Visit: Payer: Self-pay | Admitting: Family Medicine

## 2024-03-03 ENCOUNTER — Encounter: Payer: Self-pay | Admitting: Radiology

## 2024-04-01 ENCOUNTER — Ambulatory Visit: Payer: Self-pay | Admitting: *Deleted

## 2024-04-01 ENCOUNTER — Ambulatory Visit (HOSPITAL_COMMUNITY)
Admission: RE | Admit: 2024-04-01 | Discharge: 2024-04-01 | Disposition: A | Discharge status: Home / Self Care | Source: Ambulatory Visit | Attending: Family Medicine | Admitting: Family Medicine

## 2024-04-01 ENCOUNTER — Ambulatory Visit: Admitting: Family Medicine

## 2024-04-01 VITALS — BP 148/88 | HR 75 | Resp 16 | Ht 63.0 in | Wt 163.1 lb

## 2024-04-01 DIAGNOSIS — M5410 Radiculopathy, site unspecified: Secondary | ICD-10-CM | POA: Diagnosis present

## 2024-04-01 NOTE — Telephone Encounter (Signed)
 FYI Only or Action Required?: FYI only for provider: appointment scheduled on 04/01/24.  Patient was last seen in primary care on 06/28/2023 by Antonetta Rollene BRAVO, MD.  Called Nurse Triage reporting Pain.  Symptoms began several weeks ago.  Interventions attempted: OTC medications: wearing compression hose.  Symptoms are: gradually worsening.  Triage Disposition: See Physician Within 24 Hours 4-24 hours   Patient/caregiver understands and will follow disposition?: Yes              Copied from CRM #[id number]. Topic: Clinical - Red Word Triage >> Apr 01, 2024 12:01 PM Joesph B wrote: Red Word that prompted transfer to Nurse Triage: Patient is experiencing pain in her legs, she states its related to her heart. Reason for Disposition  Localized pain, redness or hard lump along vein    No redness , no hard lumps but small knots to outer calves.  Answer Assessment - Initial Assessment Questions Appt scheduled today with PCP. Recommended if sx worsen go to ED.        1. ONSET: When did the pain start?      3-4 weeks ago  2. LOCATION: Where is the pain located?      Bilateral legs 3. PAIN: How bad is the pain?    (Scale 1-10; or mild, moderate, severe)     Dull ache, feels better when walking and pain mild now 4. WORK OR EXERCISE: Has there been any recent work or exercise that involved this part of the body?      Sitting prolonged hours at desk for work.  5. CAUSE: What do you think is causing the leg pain?     Not sure  6. OTHER SYMPTOMS: Do you have any other symptoms? (e.g., chest pain, back pain, breathing difficulty, swelling, rash, fever, numbness, weakness)     Leg pain dull ache, few lumps calf areas, looks like knots to outer calves. No swelling no chest pain no difficulty breathing no fever  7. PREGNANCY: Is there any chance you are pregnant? When was your last menstrual period?     na  Protocols used: Leg Pain-A-AH

## 2024-04-01 NOTE — Progress Notes (Unsigned)
   Kathy Stewart     MRN: [id number]      DOB: 02/08/1971  Chief Complaint  Patient presents with   Leg Pain    Pt complains of bilateral leg pain x3 weeks, describes it as an aching dull pain. Pt states she has also noticed small knots on shins and calves in same time frame     HPI Kathy Stewart is here for follow up and re-evaluation of chronic medical conditions, medication management and review of any available recent lab and radiology data.  Preventive health is updated, specifically  Cancer screening and Immunization.   Questions or concerns regarding consultations or procedures which the PT has had in the interim are  addressed. The PT denies any adverse reactions to current medications since the last visit.  There are no new concerns.  There are no specific complaints   ROS Denies recent fever or chills. Denies sinus pressure, nasal congestion, ear pain or sore throat. Denies chest congestion, productive cough or wheezing. Denies chest pains, palpitations and leg swelling Denies abdominal pain, nausea, vomiting,diarrhea or constipation.   Denies dysuria, frequency, hesitancy or incontinence. Denies joint pain, swelling and limitation in mobility. Denies headaches, seizures, numbness, or tingling. Denies depression, anxiety or insomnia. Denies skin break down or rash.   PE  BP (!) 149/84   Pulse 75   Resp 16   Ht 5' 3 (1.6 m)   Wt 163 lb 1.9 oz (74 kg)   LMP 10/17/2012   SpO2 97%   BMI 28.90 kg/m   Patient alert and oriented and in no cardiopulmonary distress.  HEENT: No facial asymmetry, EOMI,     Neck supple .  Chest: Clear to auscultation bilaterally.  CVS: S1, S2 no murmurs, no S3.Regular rate.  ABD: Soft non tender.   Ext: No edema  MS: Adequate ROM spine, shoulders, hips and knees.  Skin: Intact, no ulcerations or rash noted.  Psych: Good eye contact, normal affect. Memory intact not anxious or depressed appearing.  CNS: CN 2-12 intact, power,   normal throughout.no focal deficits noted.   Assessment & Plan  No problem-specific Assessment & Plan notes found for this encounter.

## 2024-04-01 NOTE — Patient Instructions (Addendum)
 Annual as before  VERY important you take your blood pressure pills EVERY day as prescribed, blood pressure is high. Please take them together so you do not skip or forget doses  Nodules on your legs are not blood clots, I recommend wearing compression hose when you are AWAKE TO EASE LEG DISCOMFORT  PLEASE GET XRAY OF YOUR LOW BACK, PAIN IN LEGS AFTER SITTING FOR A WHILE IS LIKELY FROM ARHTIRITS IN YOUR LOW BACK'  Please work on quitting smoking  Reduce butter and fried foods to reduce risk of heart disease since your bad cholesterol was high when we last checked it  CBC, fasting lipid, cmp and EGFr 3 to 7 days before next appointment  Thanks for choosing [facility], we consider it a privelige to serve you.

## 2024-04-09 ENCOUNTER — Ambulatory Visit: Payer: Self-pay | Admitting: Family Medicine

## 2024-04-09 DIAGNOSIS — M79604 Pain in right leg: Secondary | ICD-10-CM

## 2024-04-10 ENCOUNTER — Encounter: Payer: Self-pay | Admitting: Family Medicine

## 2024-04-10 DIAGNOSIS — R229 Localized swelling, mass and lump, unspecified: Secondary | ICD-10-CM | POA: Insufficient documentation

## 2024-04-10 NOTE — Assessment & Plan Note (Signed)
Asked:confirms currently smokes cigarettes °Assess: Unwilling to set a quit date, but is cutting back °Advise: needs to QUIT to reduce risk of cancer, cardio and cerebrovascular disease °Assist: counseled for 5 minutes and literature provided °Arrange: follow up in 2 to 4 months ° °

## 2024-04-10 NOTE — Assessment & Plan Note (Addendum)
 SABRA

## 2024-04-10 NOTE — Assessment & Plan Note (Signed)
 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

## 2024-04-10 NOTE — Assessment & Plan Note (Signed)
 Uncontrolled, not taking medication regularly as prescribed DASH diet and commitment to daily physical activity for a minimum of 30 minutes discussed and encouraged, as a part of hypertension management. The importance of attaining a healthy weight is also discussed.     04/01/2024    3:42 PM 04/01/2024    2:58 PM 06/28/2023    8:28 AM 04/26/2023    9:45 AM 03/22/2023    9:04 AM 03/13/2023   11:11 AM 04/28/2022   10:01 AM  BP/Weight  Systolic BP 148 149 123   126 894  Diastolic BP 88 84 74   80 62  Wt. (Lbs)  163.12 159 159 159 159.12   BMI  28.9 kg/m2 28.17 kg/m2 28.17 kg/m2 28.17 kg/m2 29.1 kg/m2

## 2024-04-10 NOTE — Assessment & Plan Note (Signed)
 Hyperlipidemia:Low fat diet discussed and encouraged.   Lipid Panel  Lab Results  Component Value Date   CHOL 209 (H) 09/27/2023   HDL 52 09/27/2023   LDLCALC 142 (H) 09/27/2023   TRIG 83 09/27/2023   CHOLHDL 4.6 03/20/2023     Updated lab needed at/ before next visit.

## 2024-04-10 NOTE — Assessment & Plan Note (Signed)
 Lower extremity nodules palapble, non tender and no erythema , pt reassured that these are not blood clots and not pathologic

## 2024-04-14 ENCOUNTER — Other Ambulatory Visit: Payer: Self-pay | Admitting: Family Medicine

## 2024-05-08 ENCOUNTER — Telehealth: Payer: Self-pay

## 2024-05-08 NOTE — Telephone Encounter (Signed)
 Copied from CRM [id number]. Topic: Medical Record Request - Payor/Billing Request >> May 08, 2024 11:28 AM Kathy Stewart wrote: Reason for CRM: Pt calling in requesting to get some help on bills that she received and would like for someone to call her regarding these bills.  Call from [facility] about little small bills over the years. And go to Primary every year and sometimes twice year, all these amounts sum up to over $500.  Pls follow up with pt before end of day. If after 2:30PM please call the next day.

## 2024-05-09 ENCOUNTER — Other Ambulatory Visit: Payer: Self-pay | Admitting: Family Medicine

## 2024-05-30 ENCOUNTER — Encounter (INDEPENDENT_AMBULATORY_CARE_PROVIDER_SITE_OTHER): Payer: Self-pay | Admitting: Nurse Practitioner

## 2024-06-04 ENCOUNTER — Other Ambulatory Visit: Payer: Self-pay | Admitting: Family Medicine

## 2024-06-13 ENCOUNTER — Ambulatory Visit: Admitting: Family Medicine

## 2024-06-17 ENCOUNTER — Encounter (INDEPENDENT_AMBULATORY_CARE_PROVIDER_SITE_OTHER): Payer: Self-pay | Admitting: Nurse Practitioner

## 2024-07-03 ENCOUNTER — Encounter: Payer: Self-pay | Admitting: Family Medicine
